# Patient Record
Sex: Male | Born: 1955 | Race: White | Hispanic: No | Smoking: Current every day smoker
Health system: Southern US, Community
[De-identification: ages and names within clinical notes are randomized; demographics above are authoritative.]

## PROBLEM LIST (undated history)

## (undated) DIAGNOSIS — I739 Peripheral vascular disease, unspecified: Secondary | ICD-10-CM

## (undated) DIAGNOSIS — F32A Depression, unspecified: Secondary | ICD-10-CM

## (undated) DIAGNOSIS — Z720 Tobacco use: Secondary | ICD-10-CM

## (undated) DIAGNOSIS — E039 Hypothyroidism, unspecified: Secondary | ICD-10-CM

## (undated) DIAGNOSIS — J45909 Unspecified asthma, uncomplicated: Secondary | ICD-10-CM

## (undated) DIAGNOSIS — I639 Cerebral infarction, unspecified: Secondary | ICD-10-CM

## (undated) DIAGNOSIS — R160 Hepatomegaly, not elsewhere classified: Secondary | ICD-10-CM

## (undated) DIAGNOSIS — G459 Transient cerebral ischemic attack, unspecified: Secondary | ICD-10-CM

## (undated) DIAGNOSIS — F329 Major depressive disorder, single episode, unspecified: Secondary | ICD-10-CM

## (undated) DIAGNOSIS — J449 Chronic obstructive pulmonary disease, unspecified: Secondary | ICD-10-CM

## (undated) DIAGNOSIS — C4490 Unspecified malignant neoplasm of skin, unspecified: Secondary | ICD-10-CM

## (undated) DIAGNOSIS — F1010 Alcohol abuse, uncomplicated: Secondary | ICD-10-CM

## (undated) DIAGNOSIS — I779 Disorder of arteries and arterioles, unspecified: Secondary | ICD-10-CM

## (undated) HISTORY — DX: Hepatomegaly, not elsewhere classified: R16.0

## (undated) HISTORY — DX: Depression, unspecified: F32.A

## (undated) HISTORY — DX: Cerebral infarction, unspecified: I63.9

## (undated) HISTORY — PX: ILIAC ARTERY STENT: SHX1786

## (undated) HISTORY — DX: Unspecified malignant neoplasm of skin, unspecified: C44.90

## (undated) HISTORY — DX: Disorder of arteries and arterioles, unspecified: I77.9

## (undated) HISTORY — DX: Transient cerebral ischemic attack, unspecified: G45.9

## (undated) HISTORY — DX: Tobacco use: Z72.0

## (undated) HISTORY — DX: Hypothyroidism, unspecified: E03.9

## (undated) HISTORY — DX: Major depressive disorder, single episode, unspecified: F32.9

## (undated) HISTORY — DX: Alcohol abuse, uncomplicated: F10.10

## (undated) HISTORY — DX: Peripheral vascular disease, unspecified: I73.9

---

## 2017-02-18 ENCOUNTER — Encounter: Payer: Self-pay | Admitting: Emergency Medicine

## 2017-02-18 ENCOUNTER — Emergency Department
Admission: EM | Admit: 2017-02-18 | Discharge: 2017-02-19 | Disposition: A | Discharge status: Home / Self Care | Payer: Medicare Other | Attending: Emergency Medicine | Admitting: Emergency Medicine

## 2017-02-18 DIAGNOSIS — R45851 Suicidal ideations: Secondary | ICD-10-CM | POA: Diagnosis not present

## 2017-02-18 DIAGNOSIS — F1010 Alcohol abuse, uncomplicated: Secondary | ICD-10-CM | POA: Diagnosis not present

## 2017-02-18 DIAGNOSIS — F17210 Nicotine dependence, cigarettes, uncomplicated: Secondary | ICD-10-CM | POA: Diagnosis not present

## 2017-02-18 DIAGNOSIS — F1994 Other psychoactive substance use, unspecified with psychoactive substance-induced mood disorder: Secondary | ICD-10-CM

## 2017-02-18 DIAGNOSIS — F329 Major depressive disorder, single episode, unspecified: Secondary | ICD-10-CM | POA: Diagnosis not present

## 2017-02-18 DIAGNOSIS — J449 Chronic obstructive pulmonary disease, unspecified: Secondary | ICD-10-CM | POA: Diagnosis not present

## 2017-02-18 DIAGNOSIS — Z008 Encounter for other general examination: Secondary | ICD-10-CM | POA: Diagnosis present

## 2017-02-18 HISTORY — DX: Chronic obstructive pulmonary disease, unspecified: J44.9

## 2017-02-18 LAB — CBC WITH DIFFERENTIAL/PLATELET
BASOS ABS: 0.1 10*3/uL (ref 0–0.1)
Basophils Relative: 3 %
EOS PCT: 5 %
Eosinophils Absolute: 0.2 10*3/uL (ref 0–0.7)
HCT: 38.5 % — ABNORMAL LOW (ref 40.0–52.0)
Hemoglobin: 13.6 g/dL (ref 13.0–18.0)
LYMPHS PCT: 55 %
Lymphs Abs: 2.8 10*3/uL (ref 1.0–3.6)
MCH: 38 pg — ABNORMAL HIGH (ref 26.0–34.0)
MCHC: 35.5 g/dL (ref 32.0–36.0)
MCV: 107.1 fL — ABNORMAL HIGH (ref 80.0–100.0)
Monocytes Absolute: 0.3 10*3/uL (ref 0.2–1.0)
Monocytes Relative: 5 %
NEUTROS ABS: 1.6 10*3/uL (ref 1.4–6.5)
NEUTROS PCT: 32 %
PLATELETS: 115 10*3/uL — AB (ref 150–440)
RBC: 3.59 MIL/uL — AB (ref 4.40–5.90)
RDW: 13.6 % (ref 11.5–14.5)
WBC: 5.1 10*3/uL (ref 3.8–10.6)

## 2017-02-18 LAB — COMPREHENSIVE METABOLIC PANEL
ALT: 21 U/L (ref 17–63)
ANION GAP: 10 (ref 5–15)
AST: 63 U/L — ABNORMAL HIGH (ref 15–41)
Albumin: 3.6 g/dL (ref 3.5–5.0)
Alkaline Phosphatase: 74 U/L (ref 38–126)
BUN: 5 mg/dL — ABNORMAL LOW (ref 6–20)
CO2: 22 mmol/L (ref 22–32)
Calcium: 8.9 mg/dL (ref 8.9–10.3)
Chloride: 103 mmol/L (ref 101–111)
Creatinine, Ser: 0.97 mg/dL (ref 0.61–1.24)
GFR calc Af Amer: >60 mL/min (ref >60)
Glucose, Bld: 86 mg/dL (ref 65–99)
POTASSIUM: 4.5 mmol/L (ref 3.5–5.1)
Sodium: 135 mmol/L (ref 135–145)
TOTAL PROTEIN: 7.4 g/dL (ref 6.5–8.1)
Total Bilirubin: 0.8 mg/dL (ref 0.3–1.2)

## 2017-02-18 LAB — ETHANOL: ALCOHOL ETHYL (B): 343 mg/dL — AB (ref <5)

## 2017-02-18 LAB — URINE DRUG SCREEN, QUALITATIVE (ARMC ONLY)
AMPHETAMINES, UR SCREEN: NOT DETECTED
BENZODIAZEPINE, UR SCRN: NOT DETECTED
Barbiturates, Ur Screen: NOT DETECTED
Cannabinoid 50 Ng, Ur ~~LOC~~: NOT DETECTED
Cocaine Metabolite,Ur ~~LOC~~: NOT DETECTED
MDMA (Ecstasy)Ur Screen: NOT DETECTED
METHADONE SCREEN, URINE: NOT DETECTED
Opiate, Ur Screen: NOT DETECTED
PHENCYCLIDINE (PCP) UR S: NOT DETECTED
TRICYCLIC, UR SCREEN: NOT DETECTED

## 2017-02-18 NOTE — BH Assessment (Signed)
Assessment Note  Dylan Ramos is an 61 y.o. male. [name] arrived to the ED by way of [facility] [employer].  He reports that he has no ideal as to why he is here "I told my daughter to come take my pistol because I was having suicidal thoughts.  He states that "Why would I call for her to get the pistols if I really was going to hurt myself".  He shared that his wife committed suicide 13 years ago and he saw what it did to his daughters and he had no intent on harming himself. He reports symptoms of depression.  He states that he worries because he cannot afford medical insurance and he is on disability and he is unable to finance the things he needs.  He shared that he has no intentions of killing himself. He states that he has never had services for his depressive symptoms. He denied symptoms of anxiety. He reports that he can see "paranormal things", but does not have auditory hallucinations. He denied suicidal intent. He denied homicidal ideation or intent.  He reports that he drinks beer every day.  He denied use of drugs. BAC = 343. IVC paperwork reports "Threats to kill himself saying he is going to hurt others, seeing people who are not there". TTS contacted Judeth Horn (Daughter) - [phone].  She reports "He was being belligerent and talking about suicide.  She states that he said that he was going to take the gun he gave her to blow his head off. She further states that he stated that he would fly off the handle and hurt somebody.  She shared that when he was talking to the he was "talking to someone who was inside of himself". She reports that he is a severe alcoholic.  Diagnosis: Depression, SI, Visual hallucinations, Alcohol abuse  Past Medical History:  Past Medical History:  Diagnosis Date  . COPD (chronic obstructive pulmonary disease) ([place])     History reviewed. No pertinent surgical history.  Family History: No family history on file.  Social History:  reports  that he has been smoking Cigarettes.  He has been smoking about 1.50 packs per day. He does not have any smokeless tobacco history on file. His alcohol and drug histories are not on file.  Additional Social History:  Alcohol / Drug Use History of alcohol / drug use?: Yes Substance #1 Name of Substance 1: Beer (Alcohol) 1 - Age of First Use: 9 1 - Amount (size/oz): 3-4 natural ice 1 - Frequency: daily 1 - Last Use / Amount: 02/18/2017  CIWA: CIWA-Ar BP: 136/79 Pulse Rate: 84 COWS:    Allergies: No Known Allergies  Home Medications:  (Not in a hospital admission)  OB/GYN Status:  No LMP for male patient.  General Assessment Data Location of Assessment: [facility] ED TTS Assessment: In system Is this a Tele or Face-to-Face Assessment?: Face-to-Face Is this an Initial Assessment or a Re-assessment for this encounter?: Initial Assessment Marital status: Widowed [place] name: n/a Is patient pregnant?: No Pregnancy Status: No Living Arrangements: Alone (Rooming house) Can pt return to current living arrangement?: Yes Admission Status: Involuntary Is patient capable of signing voluntary admission?: Yes Referral Source: Self/Family/Friend Insurance type: None  Medical Screening Exam ([place]) Medical Exam completed: Yes  Crisis Care Plan Living Arrangements: Alone (Rooming house) Legal Guardian: Other: (Self) Name of Psychiatrist: None Name of Therapist: None  Education Status Is patient currently in school?: No Current Grade: n/a Highest grade  of school patient has completed: 3rd Name of school: n/a Contact person: n/a  Risk to self with the past 6 months Suicidal Ideation: Yes-Currently Present Has patient been a risk to self within the past 6 months prior to admission? : No Suicidal Intent: No Has patient had any suicidal intent within the past 6 months prior to admission? : No Is patient at risk for suicide?: No Suicidal Plan?: No Has patient had any suicidal  plan within the past 6 months prior to admission? : No Access to Means: No What has been your use of drugs/alcohol within the last 12 months?: drinks beer daily Previous Attempts/Gestures: No How many times?: 0 Other Self Harm Risks: denied Triggers for Past Attempts: None known Intentional Self Injurious Behavior: None Family Suicide History: Yes (Wife committed suicide) Recent stressful life event(s): Financial Problems Persecutory voices/beliefs?: No Depression: Yes Depression Symptoms: Despondent Substance abuse history and/or treatment for substance abuse?: Yes Suicide prevention information given to non-admitted patients: Not applicable  Risk to Others within the past 6 months Homicidal Ideation: No Does patient have any lifetime risk of violence toward others beyond the six months prior to admission? : No Thoughts of Harm to Others: No Current Homicidal Intent: No Current Homicidal Plan: No Access to Homicidal Means: No Identified Victim: None identified History of harm to others?: No Assessment of Violence: None Noted Violent Behavior Description: None reported Does patient have access to weapons?: No (Gun given to his daughter) Criminal Charges Pending?: No Does patient have a court date: No Is patient on probation?: No  Psychosis Hallucinations: Visual (None present at this time) Delusions: None noted  Mental Status Report Appearance/Hygiene: In scrubs Eye Contact: Good Motor Activity: Freedom of movement Speech: Tangential Level of Consciousness: Alert Mood: Pleasant Affect: Appropriate to circumstance Anxiety Level: None Thought Processes: Tangential Judgement: Unable to Assess Orientation: Person, Place, Time, Situation Obsessive Compulsive Thoughts/Behaviors: None  Cognitive Functioning Concentration: Fair Memory: Recent Intact IQ: Average  ADLScreening [facility] Assessment Services) Patient's cognitive ability adequate to safely complete daily  activities?: Yes Patient able to express need for assistance with ADLs?: Yes Independently performs ADLs?: Yes (appropriate for developmental age)  Prior Inpatient Therapy Prior Inpatient Therapy: No Prior Therapy Dates: n/a Prior Therapy Facilty/Provider(s): n/a Reason for Treatment: n/a  Prior Outpatient Therapy Prior Outpatient Therapy: No Prior Therapy Dates: n/a Prior Therapy Facilty/Provider(s): n/a Reason for Treatment: n/a Does patient have an ACCT team?: No Does patient have Intensive In-House Services?  : No Does patient have [facility] services? : No Does patient have P4CC services?: No  ADL Screening (condition at time of admission) Patient's cognitive ability adequate to safely complete daily activities?: Yes Is the patient deaf or have difficulty hearing?: Yes Does the patient have difficulty seeing, even when wearing glasses/contacts?: No Does the patient have difficulty concentrating, remembering, or making decisions?: No Patient able to express need for assistance with ADLs?: Yes Does the patient have difficulty dressing or bathing?: Yes Independently performs ADLs?: Yes (appropriate for developmental age) Does the patient have difficulty walking or climbing stairs?: Yes Weakness of Legs: Both Weakness of Arms/Hands: None  Home Assistive Devices/Equipment Home Assistive Devices/Equipment: Cane (specify quad or straight), Eyeglasses, Walker (specify type)    Abuse/Neglect Assessment (Assessment to be complete while patient is alone) Physical Abuse: Denies Verbal Abuse: Denies Sexual Abuse: Denies Exploitation of patient/patient's resources: Denies Self-Neglect: Denies     Regulatory affairs officer (For Healthcare) Does Patient Have a Medical Advance Directive?: No    Additional Information 1:1  In Past 12 Months?: No CIRT Risk: No Elopement Risk: No Does patient have medical clearance?: Yes     Disposition:  Disposition Initial Assessment Completed for  this Encounter: Yes Disposition of Patient: Other dispositions  On Site Evaluation by:   Reviewed with Physician:    Elmer Bales 02/18/2017 9:51 PM

## 2017-02-18 NOTE — ED Provider Notes (Signed)
[facility] Emergency Department Provider Note   ____________________________________________   I have reviewed the triage vital signs and the nursing notes.   HISTORY  Chief Complaint Psychiatric Evaluation   History limited by: Intoxication   HPI Dylan Ramos is a 61 y.o. male who presents to the emergency department today because of concerns for suicidal ideation. The patient at present under IVC. The patient states that today he snapped. He would not elaborate as to what caused him to snap. He did realize that he was a danger to himself and he called his daughter to take his gun away. He has been drinking alcohol today. Denies thoughts of wanting to harm himself in the past. Denies any recent medical illness.   Past Medical History:  Diagnosis Date  . COPD (chronic obstructive pulmonary disease) ([facility])     There are no active problems to display for this patient.   History reviewed. No pertinent surgical history.  Prior to Admission medications   Not on File    Allergies Patient has no known allergies.  No family history on file.  Social History Social History  Substance Use Topics  . Smoking status: Current Every Day Smoker    Packs/day: 1.50    Types: Cigarettes  . Smokeless tobacco: Not on file  . Alcohol use Not on file    Review of Systems Constitutional: No fever/chills Eyes: No visual changes. ENT: No sore throat. Cardiovascular: Denies chest pain. Respiratory: Denies shortness of breath. Gastrointestinal: No abdominal pain.  No nausea, no vomiting.  No diarrhea.   Genitourinary: Negative for dysuria. Musculoskeletal: Negative for back pain. Skin: Negative for rash. Neurological: Negative for headaches, focal weakness or numbness.  ____________________________________________   PHYSICAL EXAM:  VITAL SIGNS: ED Triage Vitals  Enc Vitals Group     BP 02/18/17 1908 136/79     Pulse Rate 02/18/17 1908 84     Resp  02/18/17 1908 20     Temp 02/18/17 1908 98.5 F (36.9 C)     Temp Source 02/18/17 1908 Oral     SpO2 02/18/17 1908 96 %     Weight 02/18/17 1909 30 lb (13.6 kg)     Height 02/18/17 1909 5\' 8"  (1.727 m)     Head Circumference --      Peak Flow --      Pain Score 02/18/17 1907 0   Constitutional: Alert and oriented. Well appearing and in no distress. Eyes: Conjunctivae are normal.  ENT   Head: Normocephalic and atraumatic.   Nose: No congestion/rhinnorhea.   Mouth/Throat: Mucous membranes are moist.   Neck: No stridor. Hematological/Lymphatic/Immunilogical: No cervical lymphadenopathy. Cardiovascular: Normal rate, regular rhythm.  No murmurs, rubs, or gallops.  Respiratory: Normal respiratory effort without tachypnea nor retractions. Breath sounds are clear and equal bilaterally. No wheezes/rales/rhonchi. Gastrointestinal: Soft and non tender. No rebound. No guarding.  Genitourinary: Deferred Musculoskeletal: Normal range of motion in all extremities. No lower extremity edema. Neurologic:  Normal speech and language. No gross focal neurologic deficits are appreciated.  Skin:  Skin is warm, dry and intact. No rash noted. Psychiatric: Slightly intoxicated. Patient does endorse the thought of wanting hurt himself earlier today. ____________________________________________    LABS (pertinent positives/negatives)  Labs Reviewed  COMPREHENSIVE METABOLIC PANEL - Abnormal; Notable for the following:       Result Value   BUN 5 (*)    AST 63 (*)    All other components within normal limits  ETHANOL - Abnormal; Notable  for the following:    Alcohol, Ethyl (B) 343 (*)    All other components within normal limits  CBC WITH DIFFERENTIAL/PLATELET - Abnormal; Notable for the following:    RBC 3.59 (*)    HCT 38.5 (*)    MCV 107.1 (*)    MCH 38.0 (*)    Platelets 115 (*)    All other components within normal limits  URINE DRUG SCREEN, QUALITATIVE ([facility] ONLY)      ____________________________________________   EKG  None  ____________________________________________    RADIOLOGY  None  ____________________________________________   PROCEDURES  Procedures  ____________________________________________   INITIAL IMPRESSION / ASSESSMENT AND PLAN / ED COURSE  Pertinent labs & imaging results that were available during my care of the patient were reviewed by me and considered in my medical decision making (see chart for details).  Patient presents to the emergency department today under IVC because of concerns for suicidal ideation. This point patient is somewhat intoxicated. Will have patient be valuable SOC.  ____________________________________________   FINAL CLINICAL IMPRESSION(S) / ED DIAGNOSES  Final diagnoses:  Alcohol abuse  Suicidal ideation     Note: This dictation was prepared with Dragon dictation. Any transcriptional errors that result from this process are unintentional     Nance Pear, MD 02/18/17 2108

## 2017-02-18 NOTE — ED Triage Notes (Signed)
Patient states he had some problems today and thought of harming self. States he asked his daughter to keep his gun so he would not shoot himself. Daughter petitioned for IVC. Arrives with officer.

## 2017-02-18 NOTE — ED Notes (Signed)
Nurse was handed sticky note of patient's daughter's number who will pick him up when he is discharged [phone].

## 2017-02-18 NOTE — ED Notes (Signed)
Date and time results received: 02/18/17 1958  Test: ETOH Critical Value: 343  Name of Provider Notified: Dr. Archie Balboa

## 2017-02-19 DIAGNOSIS — F1994 Other psychoactive substance use, unspecified with psychoactive substance-induced mood disorder: Secondary | ICD-10-CM

## 2017-02-19 DIAGNOSIS — F1010 Alcohol abuse, uncomplicated: Secondary | ICD-10-CM

## 2017-02-19 MED ORDER — VITAMIN B-1 100 MG PO TABS
100.0000 mg | ORAL_TABLET | Freq: Every day | ORAL | Status: DC
  Administered 2017-02-19: 100 mg via ORAL
  Filled 2017-02-19: qty 1

## 2017-02-19 MED ORDER — LORAZEPAM 2 MG/ML IJ SOLN: 0.0000 mg | Freq: Two times a day (BID) | INTRAMUSCULAR | Status: DC

## 2017-02-19 MED ORDER — THIAMINE HCL 100 MG/ML IJ SOLN: 100.0000 mg | Freq: Every day | INTRAMUSCULAR | Status: DC

## 2017-02-19 MED ORDER — LORAZEPAM 1 MG PO TABS
ORAL_TABLET | ORAL | Status: AC
  Filled 2017-02-19: qty 1

## 2017-02-19 MED ORDER — LORAZEPAM 2 MG PO TABS
0.0000 mg | ORAL_TABLET | Freq: Four times a day (QID) | ORAL | Status: DC
  Administered 2017-02-19: 1 mg via ORAL

## 2017-02-19 MED ORDER — LORAZEPAM 2 MG PO TABS: 0.0000 mg | ORAL_TABLET | Freq: Two times a day (BID) | ORAL | Status: DC

## 2017-02-19 MED ORDER — LORAZEPAM 2 MG/ML IJ SOLN: 0.0000 mg | Freq: Four times a day (QID) | INTRAMUSCULAR | Status: DC

## 2017-02-19 NOTE — ED Notes (Signed)
Pt is being d/c home. All belongings is being sent home with patient. Pt verbalized understanding of d/c. Pt denies si/hi/vh/and ah prior to d/c. Pt denies pain. No behavioral issues reported/noted prior to d/c. CIWA score is an zero prior to d/c.

## 2017-02-19 NOTE — ED Provider Notes (Signed)
-----------------------------------------   1:43 AM on 02/19/2017 -----------------------------------------   Blood pressure 136/79, pulse 84, temperature 98.5 F (36.9 C), temperature source Oral, resp. rate 20, height 5\' 8"  (1.727 m), weight 13.6 kg (30 lb), SpO2 96 %.  The patient attempted specialist on-call evaluation however he was too intoxicated. He will require formal psychiatric evaluation in the morning.   Darel Hong, MD 02/19/17 [phone]

## 2017-02-19 NOTE — BH Assessment (Signed)
Clinician consulted with Dr.Clapacs and pt is recommended for d/c. Clinician provided pt with information on accessing community OPT and substance use services.  Pt's daughter Vivien Rota) called inquiring about disposition. Clinician took daughter's contact information and provided it to Pt. Pt states he will contact his daughter to inform her of d/c recommendation.

## 2017-02-19 NOTE — ED Notes (Signed)
Pt is awake. Pt is alert. Pt denies thoughts of self harm. Pt denies ah/vh/and hi at this time. Pt verbalized "he feels safe to go home and have no plans to harm himself or others". Pt denies pain. No s/sx of withdrawals notice/reported. Will cont to monitor pt.

## 2017-02-19 NOTE — ED Notes (Signed)
Patient presents today with officer and is IVC'ed. Patient states he asked daughter to take away gun so he did not hurt himself

## 2017-02-19 NOTE — Consult Note (Signed)
[facility] Face-to-Face Psychiatry Consult   Reason for Consult:  Consult for 61 year old man with a history of alcohol abuse brought to the hospital under involuntary commitment alleging suicidal statements Referring Physician:  Jimmye Norman Patient Identification: Dylan Ramos MRN:  [id number] Principal Diagnosis: Substance induced mood disorder [facility]) Diagnosis:   Patient Active Problem List   Diagnosis Date Noted  . Substance induced mood disorder ([place]) [F19.94] 02/19/2017  . Alcohol abuse [F10.10] 02/19/2017    Total Time spent with patient: 1 hour  Subjective:   Dylan Ramos is a 61 y.o. male patient admitted with "I don't know why they thought I needed to come here".  HPI:  Patient interviewed chart reviewed. 61 year old man came to the emergency room under IVC last night reports by his family that he had made suicidal statements. They reported that he had asked his daughter to take his gun away because he had been making statements about wanting to shoot himself. Consistently since being in the emergency room the patient has denied ever making any statement about shooting himself. He says that he would've never asked his daughter to take the gun in the first place if he actually wanted to harm himself. Patient denies feeling depressed consistently. He says he sleeps fairly well. He does admit that he is weak and run down much of the time and does not eat well. He denies any wish to die or wish to harm himself or wish to harm anyone else. He denies any hallucinations. He admits that he drinks on a daily basis although he tends to minimize it. His blood alcohol level when he presented last night was over 300 although he claims he only had 3 regular size beers yesterday. Patient is not currently receiving any outpatient psychiatric treatment. Admits that he has only shaky memories of the events leading up to his eating brought to the emergency room. He also admits that he has chronic stresses from his  financial problems and his pain and medical issues.  Social history: Patient gets disability. Not eligible apparently for Medicaid. Lives in a rooming house. Has 3 adult daughters who live nearby.  Medical history: History of COPD also history of vascular disease with several stents in his legs and aorta. Despite this he continues to smoke heavily as well as to drink.  Substance abuse history: Long-standing alcohol abuse. He says he's never had a seizure or had delirium tremens. He claims he can stop drinking for weeks at a time when he wants to but he simply prefers to drink alcohol. Denies any abuse of any other drugs. He's had inpatient rehabilitation years ago in the past but no recent substance abuse treatment.  Past Psychiatric History: No history of suicide attempts no history of psychiatric hospitalization. He's not sure if he's ever been prescribed any medicine for depression or anxiety. No known prior psychiatric contact here.    Risk to Self: Suicidal Ideation: Yes-Currently Present Suicidal Intent: No Is patient at risk for suicide?: No Suicidal Plan?: No Access to Means: No What has been your use of drugs/alcohol within the last 12 months?: drinks beer daily How many times?: 0 Other Self Harm Risks: denied Triggers for Past Attempts: None known Intentional Self Injurious Behavior: None Risk to Others: Homicidal Ideation: No Thoughts of Harm to Others: No Current Homicidal Intent: No Current Homicidal Plan: No Access to Homicidal Means: No Identified Victim: None identified History of harm to others?: No Assessment of Violence: None Noted Violent Behavior Description: None reported  Does patient have access to weapons?: No (Gun given to his daughter) Criminal Charges Pending?: No Does patient have a court date: No Prior Inpatient Therapy: Prior Inpatient Therapy: No Prior Therapy Dates: n/a Prior Therapy Facilty/Provider(s): n/a Reason for Treatment: n/a Prior  Outpatient Therapy: Prior Outpatient Therapy: No Prior Therapy Dates: n/a Prior Therapy Facilty/Provider(s): n/a Reason for Treatment: n/a Does patient have an ACCT team?: No Does patient have Intensive In-House Services?  : No Does patient have [facility] services? : No Does patient have P4CC services?: No  Past Medical History:  Past Medical History:  Diagnosis Date  . COPD (chronic obstructive pulmonary disease) ([place])    History reviewed. No pertinent surgical history. Family History: No family history on file. Family Psychiatric  History: Patient says there is substance abuse in his family but does not know of any other mental health problems except that he says one of his daughters is depressed Social History:  History  Alcohol use Not on file     History  Drug use: Unknown    Social History   Social History  . Marital status: Married    Spouse name: N/A  . Number of children: N/A  . Years of education: N/A   Social History Main Topics  . Smoking status: Current Every Day Smoker    Packs/day: 1.50    Types: Cigarettes  . Smokeless tobacco: None  . Alcohol use None  . Drug use: Unknown  . Sexual activity: Not Asked   Other Topics Concern  . None   Social History Narrative  . None   Additional Social History:    Allergies:  No Known Allergies  Labs:  Results for orders placed or performed during the hospital encounter of 02/18/17 (from the past 48 hour(s))  Comprehensive metabolic panel     Status: Abnormal   Collection Time: 02/18/17  7:15 PM  Result Value Ref Range   Sodium 135 135 - 145 mmol/L   Potassium 4.5 3.5 - 5.1 mmol/L   Chloride 103 101 - 111 mmol/L   CO2 22 22 - 32 mmol/L   Glucose, Bld 86 65 - 99 mg/dL   BUN 5 (L) 6 - 20 mg/dL   Creatinine, Ser 0.97 0.61 - 1.24 mg/dL   Calcium 8.9 8.9 - 10.3 mg/dL   Total Protein 7.4 6.5 - 8.1 g/dL   Albumin 3.6 3.5 - 5.0 g/dL   AST 63 (H) 15 - 41 U/L   ALT 21 17 - 63 U/L   Alkaline Phosphatase 74 38 -  126 U/L   Total Bilirubin 0.8 0.3 - 1.2 mg/dL   GFR calc non Af Amer >60 >60 mL/min   GFR calc Af Amer >60 >60 mL/min    Comment: (NOTE) The eGFR has been calculated using the CKD EPI equation. This calculation has not been validated in all clinical situations. eGFR's persistently <60 mL/min signify possible Chronic Kidney Disease.    Anion gap 10 5 - 15  Ethanol     Status: Abnormal   Collection Time: 02/18/17  7:15 PM  Result Value Ref Range   Alcohol, Ethyl (B) 343 (HH) <5 mg/dL    Comment: CRITICAL RESULT CALLED TO, READ BACK BY AND VERIFIED WITH REBECCA UHORCHUK AT 2000 ON 02/18/2017 JJB        LOWEST DETECTABLE LIMIT FOR SERUM ALCOHOL IS 5 mg/dL FOR MEDICAL PURPOSES ONLY   Urine Drug Screen, Qualitative     Status: None   Collection Time: 02/18/17  7:15 PM  Result Value Ref Range   Tricyclic, Ur Screen NONE DETECTED NONE DETECTED   Amphetamines, Ur Screen NONE DETECTED NONE DETECTED   MDMA (Ecstasy)Ur Screen NONE DETECTED NONE DETECTED   Cocaine Metabolite,Ur [place] NONE DETECTED NONE DETECTED   Opiate, Ur Screen NONE DETECTED NONE DETECTED   Phencyclidine (PCP) Ur S NONE DETECTED NONE DETECTED   Cannabinoid 50 Ng, Ur [place] NONE DETECTED NONE DETECTED   Barbiturates, Ur Screen NONE DETECTED NONE DETECTED   Benzodiazepine, Ur Scrn NONE DETECTED NONE DETECTED   Methadone Scn, Ur NONE DETECTED NONE DETECTED    Comment: (NOTE) 169  Tricyclics, urine               Cutoff 1000 ng/mL 200  Amphetamines, urine             Cutoff 1000 ng/mL 300  MDMA (Ecstasy), urine           Cutoff 500 ng/mL 400  Cocaine Metabolite, urine       Cutoff 300 ng/mL 500  Opiate, urine                   Cutoff 300 ng/mL 600  Phencyclidine (PCP), urine      Cutoff 25 ng/mL 700  Cannabinoid, urine              Cutoff 50 ng/mL 800  Barbiturates, urine             Cutoff 200 ng/mL 900  Benzodiazepine, urine           Cutoff 200 ng/mL 1000 Methadone, urine                Cutoff 300 ng/mL 1100 1200 The urine  drug screen provides only a preliminary, unconfirmed 1300 analytical test result and should not be used for non-medical 1400 purposes. Clinical consideration and professional judgment should 1500 be applied to any positive drug screen result due to possible 1600 interfering substances. A more specific alternate chemical method 1700 must be used in order to obtain a confirmed analytical result.  1800 Gas chromato graphy / mass spectrometry (GC/MS) is the preferred 1900 confirmatory method.   CBC with Diff     Status: Abnormal   Collection Time: 02/18/17  7:15 PM  Result Value Ref Range   WBC 5.1 3.8 - 10.6 K/uL   RBC 3.59 (L) 4.40 - 5.90 MIL/uL   Hemoglobin 13.6 13.0 - 18.0 g/dL   HCT 38.5 (L) 40.0 - 52.0 %   MCV 107.1 (H) 80.0 - 100.0 fL   MCH 38.0 (H) 26.0 - 34.0 pg   MCHC 35.5 32.0 - 36.0 g/dL   RDW 13.6 11.5 - 14.5 %   Platelets 115 (L) 150 - 440 K/uL   Neutrophils Relative % 32 %   Neutro Abs 1.6 1.4 - 6.5 K/uL   Lymphocytes Relative 55 %   Lymphs Abs 2.8 1.0 - 3.6 K/uL   Monocytes Relative 5 %   Monocytes Absolute 0.3 0.2 - 1.0 K/uL   Eosinophils Relative 5 %   Eosinophils Absolute 0.2 0 - 0.7 K/uL   Basophils Relative 3 %   Basophils Absolute 0.1 0 - 0.1 K/uL    Current Facility-Administered Medications  Medication Dose Route Frequency Provider Last Rate Last Dose  . LORazepam (ATIVAN) injection 0-4 mg  0-4 mg Intravenous Q6H Darel Hong, MD       Or  . LORazepam (ATIVAN) tablet 0-4 mg  0-4 mg Oral Q6H Darel Hong, MD  1 mg at 02/19/17 0221  . [START ON 02/21/2017] LORazepam (ATIVAN) injection 0-4 mg  0-4 mg Intravenous Q12H Darel Hong, MD       Or  . Derrill Memo ON 02/21/2017] LORazepam (ATIVAN) tablet 0-4 mg  0-4 mg Oral Q12H Rifenbark, Milta Deiters, MD      . thiamine (VITAMIN B-1) tablet 100 mg  100 mg Oral Daily Darel Hong, MD   100 mg at 02/19/17 1042   Or  . thiamine (B-1) injection 100 mg  100 mg Intravenous Daily Darel Hong, MD       No current  outpatient prescriptions on file.    Musculoskeletal: Strength & Muscle Tone: decreased Gait & Station: broad based Patient leans: N/A  Psychiatric Specialty Exam: Physical Exam  Nursing note and vitals reviewed. Constitutional: He appears well-developed. He has a sickly appearance.  HENT:  Head: Normocephalic and atraumatic.  Eyes: Pupils are equal, round, and reactive to light. Conjunctivae are normal.  Neck: Normal range of motion.  Cardiovascular: Normal heart sounds.   Respiratory: Effort normal.  GI: Soft.  Musculoskeletal: Normal range of motion.  Neurological: He is alert.  Skin: Skin is warm and dry.  Psychiatric: He has a normal mood and affect. His speech is normal and behavior is normal. Judgment and thought content normal. He exhibits abnormal recent memory.    Review of Systems  Constitutional: Positive for weight loss.  HENT: Negative.   Eyes: Negative.   Respiratory: Negative.   Cardiovascular: Negative.   Gastrointestinal: Negative.   Musculoskeletal: Negative.   Skin: Negative.   Neurological: Positive for weakness.  Psychiatric/Behavioral: Positive for memory loss and substance abuse. Negative for depression, hallucinations and suicidal ideas. The patient is not nervous/anxious and does not have insomnia.     Blood pressure 129/78, pulse 95, temperature 97.6 F (36.4 C), temperature source Oral, resp. rate 18, height 5' 8" (1.727 m), weight 59 kg (130 lb), SpO2 100 %.Body mass index is 19.77 kg/m.  General Appearance: Disheveled  Eye Contact:  Fair  Speech:  Clear and Coherent  Volume:  Normal  Mood:  Euthymic  Affect:  Appropriate  Thought Process:  Goal Directed  Orientation:  Full (Time, Place, and Person)  Thought Content:  Logical and Tangential  Suicidal Thoughts:  No  Homicidal Thoughts:  No  Memory:  Immediate;   Good Recent;   Fair Remote;   Fair  Judgement:  Fair  Insight:  Shallow  Psychomotor Activity:  Decreased  Concentration:   Concentration: Fair  Recall:  [employer] of Knowledge:  Fair  Language:  Fair  Akathisia:  No  Handed:  Right  AIMS (if indicated):     Assets:  Communication Skills Housing Social Support  ADL's:  Intact  Cognition:  WNL  Sleep:        Treatment Plan Summary: Plan 61 year old man who looks much older than his stated age who has chronic medical problems. Came to the emergency room intoxicated with allegations from his family that he had been talking about shooting himself. Evidently the pistol has been removed from his home and he says he has no other firearms. Patient insists that he has no suicidal intent or plan and he denies any other active symptoms of depression. No evidence of psychosis. He is calm and cooperative now with no sign of delirium. At this point patient does not meet commitment criteria. I suggested to him that when he is intoxicated he may say or do or feel things that he  would not normally do otherwise. He admits to this and admits that he would probably do better if he were to stop drinking. Patient however has no interest in admission to the hospital. He will be released from the emergency room. Discontinue IVC. Case reviewed with emergency room physician. He will be given information to refer him to local resources for outpatient substance abuse treatment  Disposition: No evidence of imminent risk to self or others at present.   Patient does not meet criteria for psychiatric inpatient admission. Supportive therapy provided about ongoing stressors.  Alethia Berthold, MD 02/19/2017 1:35 PM

## 2017-02-19 NOTE — ED Notes (Signed)
Spoke with Maudie Mercury about moving patient to [facility]

## 2017-02-19 NOTE — ED Notes (Signed)
Pt is alert and oriented although still intoxicated. Pt is pleasant and cooperative at this time. CIWA is 5. PRN medication administered and fluids provided. Pt is blaming others for being here but follows staff instructions. Pt asleep in bed soon after arrival and 15 minute checks are ongoing for safety.

## 2017-02-19 NOTE — ED Provider Notes (Signed)
Patient is now awake and sober and denying suicidal ideation. Patient states he feels comfortable going home. We will overturn commitment.   Earleen Newport, MD 02/19/17 1105

## 2017-06-04 ENCOUNTER — Ambulatory Visit: Payer: Self-pay | Admitting: Physician Assistant

## 2017-08-01 ENCOUNTER — Ambulatory Visit: Payer: Medicare Other | Admitting: Physician Assistant

## 2017-08-01 ENCOUNTER — Ambulatory Visit
Admission: RE | Admit: 2017-08-01 | Discharge: 2017-08-01 | Disposition: A | Discharge status: Home / Self Care | Payer: Medicaid Other | Source: Ambulatory Visit | Attending: Physician Assistant | Admitting: Physician Assistant

## 2017-08-01 DIAGNOSIS — Z131 Encounter for screening for diabetes mellitus: Secondary | ICD-10-CM

## 2017-08-01 DIAGNOSIS — I739 Peripheral vascular disease, unspecified: Secondary | ICD-10-CM

## 2017-08-01 DIAGNOSIS — R519 Headache, unspecified: Secondary | ICD-10-CM

## 2017-08-01 DIAGNOSIS — F32A Depression, unspecified: Secondary | ICD-10-CM

## 2017-08-01 DIAGNOSIS — J449 Chronic obstructive pulmonary disease, unspecified: Secondary | ICD-10-CM

## 2017-08-01 DIAGNOSIS — Z23 Encounter for immunization: Secondary | ICD-10-CM

## 2017-08-01 DIAGNOSIS — F1010 Alcohol abuse, uncomplicated: Secondary | ICD-10-CM

## 2017-08-01 DIAGNOSIS — F329 Major depressive disorder, single episode, unspecified: Secondary | ICD-10-CM

## 2017-08-01 DIAGNOSIS — R918 Other nonspecific abnormal finding of lung field: Secondary | ICD-10-CM | POA: Insufficient documentation

## 2017-08-01 DIAGNOSIS — Z720 Tobacco use: Secondary | ICD-10-CM

## 2017-08-01 DIAGNOSIS — Z130 Encounter for screening for diseases of the blood and blood-forming organs and certain disorders involving the immune mechanism: Secondary | ICD-10-CM

## 2017-08-01 DIAGNOSIS — G4700 Insomnia, unspecified: Secondary | ICD-10-CM

## 2017-08-01 DIAGNOSIS — H9190 Unspecified hearing loss, unspecified ear: Secondary | ICD-10-CM | POA: Diagnosis not present

## 2017-08-01 DIAGNOSIS — H269 Unspecified cataract: Secondary | ICD-10-CM

## 2017-08-01 DIAGNOSIS — R51 Headache: Secondary | ICD-10-CM

## 2017-08-01 DIAGNOSIS — Z1329 Encounter for screening for other suspected endocrine disorder: Secondary | ICD-10-CM

## 2017-08-01 DIAGNOSIS — Z0000 Encounter for general adult medical examination without abnormal findings: Secondary | ICD-10-CM

## 2017-08-01 DIAGNOSIS — M5412 Radiculopathy, cervical region: Secondary | ICD-10-CM

## 2017-08-01 DIAGNOSIS — Z13220 Encounter for screening for lipoid disorders: Secondary | ICD-10-CM

## 2017-08-01 MED ORDER — ALBUTEROL SULFATE HFA 108 (90 BASE) MCG/ACT IN AERS: 2.0000 | INHALATION_SPRAY | Freq: Four times a day (QID) | RESPIRATORY_TRACT | 1 refills | Status: DC | PRN

## 2017-08-01 MED ORDER — FLUTICASONE-UMECLIDIN-VILANT 100-62.5-25 MCG/INH IN AEPB: 1.0000 | INHALATION_SPRAY | Freq: Every day | RESPIRATORY_TRACT | 1 refills | Status: DC

## 2017-08-01 MED ORDER — DULOXETINE HCL 30 MG PO CPEP: 30.0000 mg | ORAL_CAPSULE | Freq: Every day | ORAL | 0 refills | Status: DC

## 2017-08-01 NOTE — Progress Notes (Signed)
Patient: Dylan Ramos Male    DOB: 07-20-1955   62 y.o.   MRN: [id number] Visit Date: 08/02/2017  Today's Provider: Trinna Post, PA-C   Chief Complaint  Patient presents with  . Establish Care  . COPD   Subjective:    Dylan Ramos is a 62 y/o man presenting today to establish care. He is here with his daughters Darin Arndt") and [name]. He was seen by a PCP in [place] but is needing care closer to home. He has multiple issues that need to be addressed.  COPD He has been smoking 1 - 1.5 packs per day x 50 years. He has COPD. He is currently using trelegy daily and albuterol rescue as needed, which is four times daily. He continues to smoke and does not desire to quit. He is still having difficulty breathing despite triple therapy. Would like to see a pulmonologist. He has been on disability due to COPD for many years. Family also mentions recent weight loss.  Peripheral Artery Disease He reports he has had ultrasounds prior and they showed very little blood flow in the distal aorta. He reports he has 4-5 stents in each leg in attempts to revascularize. He reports continued leg pain when walking. He also reports leg pain and cramping when eating, so much so that sometimes he might not eat for three days. He also reports erectile dysfunction.  Cataracts He has bilateral cataracts. He reports he was referred to [facility] and in fact has an appt scheduled 08/12/2017 but does not want to travel that far. He would like to be seen closer to home. He reports his vision is darkened, he can't see much of anything. He no longer drives.  Hearing Loss He has bilateral hearing loss, left worse than right. Constantly straining to hear.  Alcohol Abuse He drinks a 6 pack of beer daily. He reports going through a fifth of liquor per month. He was brought to the ER several months ago after he made suicidal gestures while inebriated. He does not want to stop  drinking.  Insomnia Reports he sometimes won't sleep for a full day. Has trouble falling asleep, then sometimes has trouble staying asleep. Or he could sleep too much, it depends.  Headaches He reports almost daily headaches, for which he takes an Advil daily. Reports bilateral, tight feeling. No aura, not pulsating. No head injuries. He reports he has a history of bulging cervical discs and cervical radiculopathy that contributes to this.  Depression  He reports he feels down and depressed some days. He says he can't see much and he can't hear much, so he sits in his house and drinks. He lives alone. His daughter lives two houses down. He denies SI/HI today.  Breathing Problem  He complains of difficulty breathing. This is a chronic problem. Associated symptoms include headaches and rhinorrhea.       No Known Allergies   Current Outpatient Medications:  .  albuterol (PROVENTIL HFA;VENTOLIN HFA) 108 (90 Base) MCG/ACT inhaler, Inhale 2 puffs into the lungs every 6 (six) hours as needed for wheezing or shortness of breath., Disp: 18 g, Rfl: 1 .  Fluticasone-Umeclidin-Vilant (TRELEGY ELLIPTA) 100-62.5-25 MCG/INH AEPB, Inhale 1 puff into the lungs daily., Disp: 28 each, Rfl: 1 .  DULoxetine (CYMBALTA) 30 MG capsule, Take 1 capsule (30 mg total) by mouth daily., Disp: 90 capsule, Rfl: 0  Review of Systems  Constitutional: Positive for fatigue.  HENT: Positive for  ear discharge and rhinorrhea.   Eyes: Positive for pain.  Neurological: Positive for dizziness, tremors, weakness, light-headedness and headaches.  Psychiatric/Behavioral: Positive for agitation, confusion, decreased concentration and sleep disturbance.    Social History   Tobacco Use  . Smoking status: Current Every Day Smoker    Packs/day: 1.50    Types: Cigarettes  Substance Use Topics  . Alcohol use: Not on file   Objective:   There were no vitals taken for this visit. There were no vitals filed for this  visit.   Physical Exam  Constitutional: No distress.  Thin, chronically ill looking man, appears older than stated age.   HENT:  Right Ear: External ear normal.  Left Ear: External ear normal.  Mouth/Throat: No oropharyngeal exudate.  Eyes: Conjunctivae are normal.  Neck: Neck supple.  Cardiovascular: Normal rate and regular rhythm.  Pulmonary/Chest: Effort normal. He has wheezes. He has no rales.  Lymphadenopathy:    He has no cervical adenopathy.        Assessment & Plan:     1. Chronic obstructive pulmonary disease, unspecified COPD type ([place])  Medications refilled today. CXR due to recent weight loss. COPD not controlled on triple therapy, will refer to pulm. Unfortunately, he continues to smoke and he has no desire to quit. He reports he had a pneumonia shot in the past.  - Ambulatory referral to Pulmonology - DG Chest 2 View; Future - albuterol (PROVENTIL HFA;VENTOLIN HFA) 108 (90 Base) MCG/ACT inhaler; Inhale 2 puffs into the lungs every 6 (six) hours as needed for wheezing or shortness of breath.  Dispense: 18 g; Refill: 1 - Fluticasone-Umeclidin-Vilant (TRELEGY ELLIPTA) 100-62.5-25 MCG/INH AEPB; Inhale 1 puff into the lungs daily.  Dispense: 28 each; Refill: 1  2. Peripheral artery disease ([place])  What appears to be very severe PAD, multiple prior stents. Sound like he may be getting claudication even from eating. Will refer for revascularization, have counseled that smoking is absolutely the cause of this. Will also probably need statin, but we must discuss goals of care.  - Ambulatory referral to Vascular Surgery  3. Cataract of both eyes, unspecified cataract type  - Ambulatory referral to Ophthalmology  4. Hearing loss, unspecified hearing loss type, unspecified laterality  - Ambulatory referral to ENT  5. Alcohol abuse  He drinks a six pack of beer a day, plus possibly some liquor. Counseled that this is considered abuse. He understands, does not want to  stop drinking. Liver enlarged today on exam with elevation of AST. Imagine he might have some cirrhosis, possible imaging later.  6. Encounter for medical examination to establish care  Daughter brings up DNR, I do think we need to discuss goals of care at next visit and how this will affect any subsequent workup of his chronic conditions.  7. Need for influenza vaccination  - Flu Vaccine QUAD 6+ mos PF IM (Fluarix Quad PF)  8. Insomnia, unspecified type  Multifactorial: depression, substance abuse, possible day/night reversal.  9. Chronic nonintractable headache, unspecified headache type  Multifactorial: depression, substance abuse, medication overuse, insomnia, dehydration and malnutrition. Advised to cut back on Advil if not working. Encouraged three balanced meals daily with adequate hydration other than alcoholic drinks.  10. Cervical radiculitis   11. Tobacco abuse  Advised to quit smoking. He does not wish to at this time. Counseled 3-10 minutes.  12. Screening cholesterol level  - Lipid Profile  13. Diabetes mellitus screening  - Comprehensive Metabolic Panel (CMET)  14. Screening for thyroid  disorder  - TSH  15. Screening for deficiency anemia  - CBC with Differential  16. Depression, unspecified depression type  - DULoxetine (CYMBALTA) 30 MG capsule; Take 1 capsule (30 mg total) by mouth daily.  Dispense: 90 capsule; Refill: 0  Return in about 1 month (around 09/01/2017) for chronic illness.  The entirety of the information documented in the History of Present Illness, Review of Systems and Physical Exam were personally obtained by me. Portions of this information were initially documented by Ashley Royalty, CMA and reviewed by me for thoroughness and accuracy.        Dylan Post, PA-C  [facility]

## 2017-08-02 LAB — CBC WITH DIFFERENTIAL/PLATELET
Basophils Absolute: 0 10*3/uL (ref 0.0–0.2)
Basos: 1 %
EOS (ABSOLUTE): 0.3 10*3/uL (ref 0.0–0.4)
Eos: 4 %
Hematocrit: 38.7 % (ref 37.5–51.0)
Hemoglobin: 13.6 g/dL (ref 13.0–17.7)
Immature Grans (Abs): 0 10*3/uL (ref 0.0–0.1)
Immature Granulocytes: 0 %
Lymphocytes Absolute: 1.5 10*3/uL (ref 0.7–3.1)
Lymphs: 20 %
MCH: 35.5 pg — ABNORMAL HIGH (ref 26.6–33.0)
MCHC: 35.1 g/dL (ref 31.5–35.7)
MCV: 101 fL — ABNORMAL HIGH (ref 79–97)
Monocytes Absolute: 0.6 10*3/uL (ref 0.1–0.9)
Monocytes: 8 %
Neutrophils Absolute: 5 10*3/uL (ref 1.4–7.0)
Neutrophils: 67 %
Platelets: 132 10*3/uL — ABNORMAL LOW (ref 150–379)
RBC: 3.83 x10E6/uL — ABNORMAL LOW (ref 4.14–5.80)
RDW: 15 % (ref 12.3–15.4)
WBC: 7.4 10*3/uL (ref 3.4–10.8)

## 2017-08-02 LAB — COMPREHENSIVE METABOLIC PANEL
ALT: 24 IU/L (ref 0–44)
AST: 62 IU/L — ABNORMAL HIGH (ref 0–40)
Albumin/Globulin Ratio: 1.2 (ref 1.2–2.2)
Albumin: 4.1 g/dL (ref 3.6–4.8)
Alkaline Phosphatase: 78 IU/L (ref 39–117)
BUN/Creatinine Ratio: 8 — ABNORMAL LOW (ref 10–24)
BUN: 7 mg/dL — ABNORMAL LOW (ref 8–27)
Bilirubin Total: 0.7 mg/dL (ref 0.0–1.2)
CO2: 19 mmol/L — ABNORMAL LOW (ref 20–29)
Calcium: 9.4 mg/dL (ref 8.6–10.2)
Chloride: 95 mmol/L — ABNORMAL LOW (ref 96–106)
Creatinine, Ser: 0.91 mg/dL (ref 0.76–1.27)
GFR calc Af Amer: 104 mL/min/{1.73_m2} (ref >59)
GFR calc non Af Amer: 90 mL/min/{1.73_m2} (ref >59)
Globulin, Total: 3.4 g/dL (ref 1.5–4.5)
Glucose: 79 mg/dL (ref 65–99)
Potassium: 4.2 mmol/L (ref 3.5–5.2)
Sodium: 138 mmol/L (ref 134–144)
Total Protein: 7.5 g/dL (ref 6.0–8.5)

## 2017-08-02 LAB — LIPID PANEL
Chol/HDL Ratio: 2 ratio (ref 0.0–5.0)
Cholesterol, Total: 136 mg/dL (ref 100–199)
HDL: 68 mg/dL (ref >39)
LDL Calculated: 55 mg/dL (ref 0–99)
Triglycerides: 63 mg/dL (ref 0–149)
VLDL Cholesterol Cal: 13 mg/dL (ref 5–40)

## 2017-08-02 LAB — TSH: TSH: 11.14 u[IU]/mL — ABNORMAL HIGH (ref 0.450–4.500)

## 2017-08-06 ENCOUNTER — Telehealth: Payer: Self-pay

## 2017-08-06 NOTE — Telephone Encounter (Signed)
-----   Message from Trinna Post, [place] sent at 08/02/2017  9:46 AM EST ----- Lungs show hyperinflation, consistent with COPD and expected with his smoking. No signs of infection, no visible pulmonary nodules.

## 2017-08-06 NOTE — Telephone Encounter (Signed)
-----   Message from Trinna Post, [place] sent at 08/02/2017 12:12 PM EST ----- Elevated liver enzyme on CMET, this is likely due to alcohol consumption. Fasting glucose is normal, no diabetes. Kidney function normal. Cholesterol well controlled. His TSH is high, might mean possible hypothyroidism, can we please add free T3 and T4 under dx elevated TSH. He has large red blood cells, also likely due to alcohol consumption, but no anemia.

## 2017-08-06 NOTE — Telephone Encounter (Signed)
T4 normal. T3 slightly low, indicating some mild hypothyroidism. We can discuss treatment if desired at follow up visit.

## 2017-08-06 NOTE — Telephone Encounter (Signed)
Left message for Donnetta Hail (Pt's daughter on the [facility]) to call back.  Pt is hard of hearing.   Thanks,   -Mickel Baas

## 2017-08-07 ENCOUNTER — Institutional Professional Consult (permissible substitution): Payer: Self-pay | Admitting: Internal Medicine

## 2017-08-07 NOTE — Telephone Encounter (Signed)
Dylan Ramos advised.    Thanks,   -Mickel Baas

## 2017-08-08 LAB — SPECIMEN STATUS REPORT

## 2017-08-08 LAB — T4, FREE: Free T4: 0.86 ng/dL (ref 0.82–1.77)

## 2017-08-08 LAB — T3, FREE: T3, Free: 1.7 pg/mL — ABNORMAL LOW (ref 2.0–4.4)

## 2017-08-13 ENCOUNTER — Encounter (INDEPENDENT_AMBULATORY_CARE_PROVIDER_SITE_OTHER): Payer: Self-pay | Admitting: Vascular Surgery

## 2017-08-16 NOTE — Progress Notes (Deleted)
The patient is a 62 year old male with a history of nicotine and alcohol abuse.  Imaging personally reviewed, chest x-ray 08/01/17, hyperinflation consistent with emphysema.

## 2017-08-19 ENCOUNTER — Institutional Professional Consult (permissible substitution): Payer: Self-pay | Admitting: Internal Medicine

## 2017-08-26 ENCOUNTER — Ambulatory Visit (INDEPENDENT_AMBULATORY_CARE_PROVIDER_SITE_OTHER): Payer: Medicaid Other | Admitting: Internal Medicine

## 2017-08-26 ENCOUNTER — Encounter: Payer: Self-pay | Admitting: Internal Medicine

## 2017-08-26 ENCOUNTER — Telehealth: Payer: Self-pay | Admitting: Internal Medicine

## 2017-08-26 VITALS — BP 124/70 | HR 80 | Ht 68.0 in | Wt 130.0 lb

## 2017-08-26 DIAGNOSIS — J449 Chronic obstructive pulmonary disease, unspecified: Secondary | ICD-10-CM

## 2017-08-26 DIAGNOSIS — F17210 Nicotine dependence, cigarettes, uncomplicated: Secondary | ICD-10-CM

## 2017-08-26 MED ORDER — NICOTINE 21 MG/24HR TD PT24: 21.0000 mg | MEDICATED_PATCH | Freq: Every day | TRANSDERMAL | 0 refills | Status: DC

## 2017-08-26 NOTE — Patient Instructions (Signed)
Will send for lung function test, lung cancer screening.  Continue your current inhalers, call if refills are needed.  Will send nicotine patches to your pharmacy.   --Quitting smoking is the most important thing that you can do for your health.  --Quitting smoking will have greater affect on your health than any medicine that we can give you.    --The best way to quit is to set a quit date, usually a day that has meaning like someone's birthday.  --Start any medication prescribed for quitting one week before you quit date. Then toss out the cigarettes on your quit date.  --If you start smoking again, start from scratch--set another quit day and try again!

## 2017-08-26 NOTE — Telephone Encounter (Signed)
Pt daughter callings stating they went to [facility] drug and were told that the Patches we sent in are not covered by insurance  Was advised by Pharmacy that if we prescribed Chantix  That it would be covered   Would like to see if we may call this in   Please advise

## 2017-08-26 NOTE — Progress Notes (Signed)
[facility] Consultation      Assessment and Plan:  Severe emphysema with dyspnea on exertion. -Emphysema noted on chest imaging. - Continue Trelegy inhaler, Ventolin inhaler. -Refer for CT lung cancer screening - At next visit will consider referral for pulmonary rehab.  Nicotine abuse. -Discussed the importance of smoke cessation, discussed various tools and ways in which he might quit. -Sent prescription for nicotine patches.  Spent 12 minutes in discussion regarding smoking cessation.  GERD. -Was on omeprazole in the past but found that it was not helpful, and found that Protonix was more helpful for his symptoms.  Date: 08/26/2017  MRN# [id number] Dylan Ramos 20-Dec-1955   Dylan Ramos is a 62 y.o. old male seen in consultation for chief complaint of:    Chief Complaint  Patient presents with  . Advice Only    per Dr. Margretta Sidle for COPD: SOB at all times: cough worse in mornings: chest pain    HPI:   The patient is a 62 year old male he appears older than stated age, with a history of nicotine and alcohol abuse. He is here with daughter and girlfriend who provide much of the history, he is very hard of hearing.  He was diagnosed with asthma 30 yrs ago, and more recently COPD. He coughs a lot in the morning with a lot of mucus. He is short of breath for much of the time. He can walk half a block and he will have to stop.  He Gets out of breath walking a [employer] and has to use a cart, he has to stop and catch his breathe in a grocery store.  He is on disability for his lungs, he worked in [employer]. He is smoking about a ppd, he has tried his wifes patches "and I smoked twice as much". He also tried nicotrol or similar and did not seem to help.  He is on Trelegy once daily, and ventolin a 3 or 4 times per day.  He has no pets at home,  He has constant reflux, he takes apple sauce, he tried omeprazole but did not help. He found protonix helped.   Desat  walk at rest on RA, sat is 98% and HR 70. Walked at slow pace, 180 feet, sat was 98% and HR 78, mild dyspnea.    Imaging personally reviewed, chest x-ray 08/01/17, hyperinflation consistent with emphysema.   PMHX:   Past Medical History:  Diagnosis Date  . COPD (chronic obstructive pulmonary disease) ([place])    Surgical Hx:  History reviewed. No pertinent surgical history. Family Hx:  History reviewed. No pertinent family history. Social Hx:   Social History   Tobacco Use  . Smoking status: Current Every Day Smoker    Packs/day: 1.00    Types: Cigarettes  . Smokeless tobacco: Never Used  Substance Use Topics  . Alcohol use: Not on file  . Drug use: Not on file   Medication:    Current Outpatient Medications:  .  albuterol (PROVENTIL HFA;VENTOLIN HFA) 108 (90 Base) MCG/ACT inhaler, Inhale 2 puffs into the lungs every 6 (six) hours as needed for wheezing or shortness of breath., Disp: 18 g, Rfl: 1 .  DULoxetine (CYMBALTA) 30 MG capsule, Take 1 capsule (30 mg total) by mouth daily., Disp: 90 capsule, Rfl: 0 .  Fluticasone-Umeclidin-Vilant (TRELEGY ELLIPTA) 100-62.5-25 MCG/INH AEPB, Inhale 1 puff into the lungs daily., Disp: 28 each, Rfl: 1   Allergies:  Patient has no known allergies.  Review of Systems:  Gen:  Denies  fever, sweats, chills HEENT: Denies blurred vision, double vision. bleeds, sore throat Cvc:  No dizziness, chest pain. Resp:   Denies cough or sputum production, shortness of breath Gi: Denies swallowing difficulty, stomach pain. Gu:  Denies bladder incontinence, burning urine Ext:   No Joint pain, stiffness. Skin: No skin rash,  hives  Endoc:  No polyuria, polydipsia. Psych: No depression, insomnia. Other:  All other systems were reviewed with the patient and were negative other that what is mentioned in the HPI.   Physical Examination:   VS: BP 124/70 (BP Location: Left Arm, Cuff Size: Normal)   Pulse 80   Ht 5\' 8"  (1.727 m)   Wt 130 lb (59 kg)    SpO2 98%   BMI 19.77 kg/m   General Appearance: No distress  Neuro:without focal findings,  speech normal,  HEENT: PERRLA, EOM intact.   Pulmonary: normal breath sounds, decreased air entry bilaterally. CardiovascularNormal S1,S2.  No m/r/g.   Abdomen: Benign, Soft, non-tender. Renal:  No costovertebral tenderness  GU:  No performed at this time. Endoc: No evident thyromegaly, no signs of acromegaly. Skin:   warm, no rashes, no ecchymosis  Extremities: normal, no cyanosis, clubbing.  Other findings:    LABORATORY PANEL:   CBC No results for input(s): WBC, HGB, HCT, PLT in the last 168 hours. ------------------------------------------------------------------------------------------------------------------  Chemistries  No results for input(s): NA, K, CL, CO2, GLUCOSE, BUN, CREATININE, CALCIUM, MG, AST, ALT, ALKPHOS, BILITOT in the last 168 hours.  Invalid input(s): GFRCGP ------------------------------------------------------------------------------------------------------------------  Cardiac Enzymes No results for input(s): TROPONINI in the last 168 hours. ------------------------------------------------------------  RADIOLOGY:  No results found.     Thank  you for the consultation and for allowing [facility] to assist in the care of your patient. Our recommendations are noted above.  Please contact us if we can be of further service.   Marda Stalker, MD.  Board Certified in Internal Medicine, Pulmonary Medicine, [place], and Sleep Medicine.  [facility] Office Number: [phone]  Patricia Pesa, M.D.  Merton Border, M.D  08/26/2017

## 2017-08-27 ENCOUNTER — Telehealth: Payer: Self-pay | Admitting: *Deleted

## 2017-08-27 ENCOUNTER — Telehealth: Payer: Self-pay | Admitting: Physician Assistant

## 2017-08-27 ENCOUNTER — Encounter: Payer: Self-pay | Admitting: *Deleted

## 2017-08-27 DIAGNOSIS — Z122 Encounter for screening for malignant neoplasm of respiratory organs: Secondary | ICD-10-CM

## 2017-08-27 DIAGNOSIS — Z87891 Personal history of nicotine dependence: Secondary | ICD-10-CM

## 2017-08-27 DIAGNOSIS — Z720 Tobacco use: Secondary | ICD-10-CM | POA: Insufficient documentation

## 2017-08-27 MED ORDER — VARENICLINE TARTRATE 0.5 MG X 11 & 1 MG X 42 PO MISC: ORAL | 0 refills | Status: DC

## 2017-08-27 NOTE — Telephone Encounter (Signed)
LMOM for pt's EC to inform her that the Chantix has been sent in and to call back with any questions.

## 2017-08-27 NOTE — Telephone Encounter (Signed)
Please advise on message below.

## 2017-08-27 NOTE — Telephone Encounter (Signed)
Received referral for initial lung cancer screening scan. Contacted patient and obtained smoking history,(current, 104 pack year) as well as answering questions related to screening process. Patient denies signs of lung cancer such as weight loss or hemoptysis. Patient denies comorbidity that would prevent curative treatment if lung cancer were found. Patient is scheduled for shared decision making visit and CT scan on 09/19/17.

## 2017-08-27 NOTE — Telephone Encounter (Signed)
PA submitted for Chantix via covermymeds [id number] Pending decision.

## 2017-08-27 NOTE — Telephone Encounter (Signed)
FYI--Pt unable to get appointment with ENT until he gets Medicaid to change his PCP to our office.Pt's daughter Annye English  advised of this and states they are working on getting this fixed .She is on [facility]

## 2017-08-27 NOTE — Telephone Encounter (Signed)
Noted, thank you

## 2017-08-27 NOTE — Telephone Encounter (Signed)
Placed order for chantix.

## 2017-08-27 NOTE — Addendum Note (Signed)
Addended by: Laverle Hobby on: 08/27/2017 11:10 AM   Modules accepted: Orders

## 2017-08-28 ENCOUNTER — Telehealth: Payer: Self-pay | Admitting: *Deleted

## 2017-08-28 ENCOUNTER — Telehealth: Payer: Self-pay | Admitting: Internal Medicine

## 2017-08-28 NOTE — Telephone Encounter (Signed)
Pt daughter calling stating pt is needing to reschedule PFT that is schedule for next week They are seeing vein doctor that day   Please call back to reschedule

## 2017-08-28 NOTE — Telephone Encounter (Signed)
PA denied patient must use OTC or without rx per insurance.

## 2017-08-28 NOTE — Telephone Encounter (Signed)
Called and left message with number [phone] that patient's daughter may call and reschedule PFT.

## 2017-09-04 ENCOUNTER — Ambulatory Visit: Payer: Self-pay | Admitting: Physician Assistant

## 2017-09-10 ENCOUNTER — Ambulatory Visit: Payer: Medicare Other | Attending: Internal Medicine

## 2017-09-10 ENCOUNTER — Encounter (INDEPENDENT_AMBULATORY_CARE_PROVIDER_SITE_OTHER): Payer: Self-pay | Admitting: Vascular Surgery

## 2017-09-17 ENCOUNTER — Ambulatory Visit: Payer: Self-pay | Admitting: Physician Assistant

## 2017-09-19 ENCOUNTER — Inpatient Hospital Stay: Payer: Medicare Other | Attending: Nurse Practitioner | Admitting: Nurse Practitioner

## 2017-09-19 ENCOUNTER — Ambulatory Visit (INDEPENDENT_AMBULATORY_CARE_PROVIDER_SITE_OTHER): Payer: Medicare Other | Admitting: Vascular Surgery

## 2017-09-19 ENCOUNTER — Encounter: Payer: Self-pay | Admitting: Physician Assistant

## 2017-09-19 ENCOUNTER — Ambulatory Visit: Admission: RE | Admit: 2017-09-19 | Payer: Self-pay | Source: Ambulatory Visit

## 2017-09-19 ENCOUNTER — Encounter (INDEPENDENT_AMBULATORY_CARE_PROVIDER_SITE_OTHER): Payer: Self-pay | Admitting: Vascular Surgery

## 2017-09-19 ENCOUNTER — Ambulatory Visit (INDEPENDENT_AMBULATORY_CARE_PROVIDER_SITE_OTHER): Payer: Medicaid Other | Admitting: Physician Assistant

## 2017-09-19 ENCOUNTER — Encounter: Payer: Self-pay | Admitting: Nurse Practitioner

## 2017-09-19 VITALS — BP 147/86 | HR 86 | Resp 15 | Ht 68.0 in | Wt 125.0 lb

## 2017-09-19 VITALS — BP 152/74 | HR 78 | Temp 98.1°F | Resp 20 | Wt 125.0 lb

## 2017-09-19 DIAGNOSIS — Z720 Tobacco use: Secondary | ICD-10-CM

## 2017-09-19 DIAGNOSIS — F1011 Alcohol abuse, in remission: Secondary | ICD-10-CM

## 2017-09-19 DIAGNOSIS — L97909 Non-pressure chronic ulcer of unspecified part of unspecified lower leg with unspecified severity: Secondary | ICD-10-CM

## 2017-09-19 DIAGNOSIS — R079 Chest pain, unspecified: Secondary | ICD-10-CM

## 2017-09-19 DIAGNOSIS — Z599 Problem related to housing and economic circumstances, unspecified: Secondary | ICD-10-CM

## 2017-09-19 DIAGNOSIS — R519 Headache, unspecified: Secondary | ICD-10-CM

## 2017-09-19 DIAGNOSIS — E039 Hypothyroidism, unspecified: Secondary | ICD-10-CM

## 2017-09-19 DIAGNOSIS — F32A Depression, unspecified: Secondary | ICD-10-CM

## 2017-09-19 DIAGNOSIS — R51 Headache: Secondary | ICD-10-CM

## 2017-09-19 DIAGNOSIS — Z598 Other problems related to housing and economic circumstances: Secondary | ICD-10-CM

## 2017-09-19 DIAGNOSIS — I70299 Other atherosclerosis of native arteries of extremities, unspecified extremity: Secondary | ICD-10-CM

## 2017-09-19 DIAGNOSIS — J449 Chronic obstructive pulmonary disease, unspecified: Secondary | ICD-10-CM | POA: Insufficient documentation

## 2017-09-19 DIAGNOSIS — R7989 Other specified abnormal findings of blood chemistry: Secondary | ICD-10-CM

## 2017-09-19 DIAGNOSIS — Z87898 Personal history of other specified conditions: Secondary | ICD-10-CM

## 2017-09-19 DIAGNOSIS — R160 Hepatomegaly, not elsewhere classified: Secondary | ICD-10-CM

## 2017-09-19 DIAGNOSIS — Z66 Do not resuscitate: Secondary | ICD-10-CM

## 2017-09-19 DIAGNOSIS — I70229 Atherosclerosis of native arteries of extremities with rest pain, unspecified extremity: Secondary | ICD-10-CM | POA: Insufficient documentation

## 2017-09-19 DIAGNOSIS — M79606 Pain in leg, unspecified: Secondary | ICD-10-CM | POA: Insufficient documentation

## 2017-09-19 DIAGNOSIS — F329 Major depressive disorder, single episode, unspecified: Secondary | ICD-10-CM

## 2017-09-19 MED ORDER — LEVOTHYROXINE SODIUM 25 MCG PO TABS: 25.0000 ug | ORAL_TABLET | Freq: Every day | ORAL | 2 refills | Status: DC

## 2017-09-19 MED ORDER — LEVOTHYROXINE SODIUM 25 MCG PO TABS: 12.5000 ug | ORAL_TABLET | Freq: Every day | ORAL | 0 refills | Status: DC

## 2017-09-19 NOTE — Progress Notes (Signed)
MRN : [id number]  Dylan Ramos is a 62 y.o. (24-Jan-1956) male who presents with chief complaint of  Chief Complaint  Patient presents with  . New Patient (Initial Visit)    PAD  .  History of Present Illness: The patient is seen for evaluation of painful lower extremities and diminished pulses associated with ulceration of the foot.  The patient notes the ulcer has been present for multiple weeks and has not been improving.  It is very painful and has had some drainage.  No specific history of trauma noted by the patient.  The patient denies fever or chills.  the patient does have diabetes which has been difficult to control.  Patient notes prior to the ulcer developing the extremities were painful particularly with ambulation or activity and the discomfort is very consistent day today. Typically, the pain occurs at less than one block, progress is as activity continues to the point that the patient must stop walking. Resting including standing still for several minutes allowed resumption of the activity and the ability to walk a similar distance before stopping again. Uneven terrain and inclined shorten the distance. The pain has been progressive over the past several years.   When last seen by his primary several issues were noted:He reports he has had ultrasounds prior and they showed very little blood flow in the distal aorta. He reports he has 4-5 stents in each leg in attempts to revascularize. He reports continued leg pain when walking. He also reports leg pain and cramping when eating, so much so that sometimes he might not eat for three days.  The patient denies rest pain or dangling of an extremity off the side of the bed during the night for relief. Multiple prior interventions with stents in both legs but no prior open surgeries.  No history of back problems or DJD of the lumbar sacral spine.   The patient denies amaurosis fugax or recent TIA symptoms. There are no recent  neurological changes noted. The patient denies history of DVT, PE or superficial thrombophlebitis. The patient denies recent episodes of angina or shortness of breath.    No outpatient medications have been marked as taking for the 09/19/17 encounter (Office Visit) with Delana Meyer, Dolores Lory, MD.    Past Medical History:  Diagnosis Date  . COPD (chronic obstructive pulmonary disease) ([place])   . Peripheral arterial disease ([place])     No past surgical history on file.  Social History Social History   Tobacco Use  . Smoking status: Current Every Day Smoker    Packs/day: 2.00    Years: 52.00    Pack years: 104.00    Types: Cigarettes  . Smokeless tobacco: Never Used  Substance Use Topics  . Alcohol use: Not on file  . Drug use: Not on file    Family History No family history on file. No family history of bleeding/clotting disorders, porphyria or autoimmune disease  No Known Allergies   REVIEW OF SYSTEMS (Negative unless checked)  Constitutional: [] Weight loss  [] Fever  [] Chills Cardiac: [] Chest pain   [] Chest pressure   [] Palpitations   [] Shortness of breath when laying flat   [] Shortness of breath with exertion. Vascular:  [x] Pain in legs with walking   [] Pain in legs at rest  [] History of DVT   [] Phlebitis   [] Swelling in legs   [] Varicose veins   [] Non-healing ulcers Pulmonary:   [] Uses home oxygen   [] Productive cough   [] Hemoptysis   [] Wheeze  []   COPD   [] Asthma Neurologic:  [] Dizziness   [] Seizures   [] History of stroke   [] History of TIA  [] Aphasia   [] Vissual changes   [] Weakness or numbness in arm   [] Weakness or numbness in leg Musculoskeletal:   [] Joint swelling   [] Joint pain   [] Low back pain Hematologic:  [] Easy bruising  [] Easy bleeding   [] Hypercoagulable state   [] Anemic Gastrointestinal:  [] Diarrhea   [] Vomiting  [] Gastroesophageal reflux/heartburn   [] Difficulty swallowing. Genitourinary:  [] Chronic kidney disease   [] Difficult urination  [] Frequent urination    [] Blood in urine Skin:  [] Rashes   [] Ulcers  Psychological:  [] History of anxiety   []  History of major depression.  Physical Examination  There were no vitals filed for this visit. There is no height or weight on file to calculate BMI. Gen: WD/WN, NAD Head: /AT, No temporalis wasting.  Ear/Nose/Throat: Hearing grossly intact, nares w/o erythema or drainage, poor dentition Eyes: PER, EOMI, sclera nonicteric.  Neck: Supple, no masses.  No bruit or JVD.  Pulmonary:  Good air movement, clear to auscultation bilaterally, no use of accessory muscles.  Cardiac: RRR, normal S1, S2, no Murmurs. Vascular: Ulcer present at the base of the right second toe.  Scab is present which is intact.  No evidence of infection.  No drainage. Vessel Right Left  Radial Palpable Palpable  Brachial Palpable Palpable  Carotid Palpable Palpable  PT  not palpable  not palpable  DP  not palpable  not palpable  Gastrointestinal: soft, non-distended. No guarding/no peritoneal signs.  Musculoskeletal: M/S 5/5 throughout.  No deformity or atrophy.  Neurologic: CN 2-12 intact. Pain and light touch intact in extremities.  Symmetrical.  Speech is fluent. Motor exam as listed above. Psychiatric: Judgment intact, Mood & affect appropriate for pt's clinical situation. Dermatologic: No rashes or ulcers noted.  No changes consistent with cellulitis. Lymph : No Cervical lymphadenopathy, no lichenification or skin changes of chronic lymphedema.  CBC Lab Results  Component Value Date   WBC 7.4 08/01/2017   HGB 13.6 08/01/2017   HCT 38.7 08/01/2017   MCV 101 (H) 08/01/2017   PLT 132 (L) 08/01/2017    BMET    Component Value Date/Time   NA 138 08/01/2017 1213   K 4.2 08/01/2017 1213   CL 95 (L) 08/01/2017 1213   CO2 19 (L) 08/01/2017 1213   GLUCOSE 79 08/01/2017 1213   GLUCOSE 86 02/18/2017 1915   BUN 7 (L) 08/01/2017 1213   CREATININE 0.91 08/01/2017 1213   CALCIUM 9.4 08/01/2017 1213   GFRNONAA 90 08/01/2017  1213   GFRAA 104 08/01/2017 1213   CrCl cannot be calculated (Patient's most recent lab result is older than the maximum 21 days allowed.).  COAG No results found for: INR, PROTIME  Radiology No results found.    Assessment/Plan 1. Atherosclerosis of artery of extremity with ulceration (HCC)  Recommend:  The patient has evidence of severe atherosclerotic changes of both lower extremities associated with ulceration and tissue loss of the foot.  This represents a limb threatening ischemia and places the patient at the risk for limb loss.  Patient should undergo CT angiography of the lower extremities with the hope for planning intervention for limb salvage.  The risks and benefits as well as the alternative therapies was discussed in detail with the patient.  All questions were answered.  Patient agrees to proceed with CT angiography.  The patient will follow up with me in the office after the scan.   -  CT ANGIO AO+BIFEM W & OR WO CONTRAST; Future - VAS [place] ABI WITH/WO TBI; Future  2. Pain of lower extremity, unspecified laterality See #1  3. Nicotine abuse Smoking cessation was stressed  4. Chronic obstructive pulmonary disease, unspecified COPD type ([place]) Continue pulmonary medications and aerosols as already ordered, these medications have been reviewed and there are no changes at this time.   5. Chest pain, unspecified type Continue cardiac and antihypertensive medications as already ordered and reviewed, no changes at this time.  Continue statin as ordered and reviewed, no changes at this time  Nitrates PRN for chest pain  - Ambulatory referral to Cardiology      Hortencia Pilar, MD  09/19/2017 8:51 AM

## 2017-09-19 NOTE — Patient Instructions (Signed)
Hypothyroidism Hypothyroidism is a disorder of the thyroid. The thyroid is a large gland that is located in the lower front of the neck. The thyroid releases hormones that control how the body works. With hypothyroidism, the thyroid does not make enough of these hormones. What are the causes? Causes of hypothyroidism may include:  Viral infections.  Pregnancy.  Your own defense system (immune system) attacking your thyroid.  Certain medicines.  Birth defects.  Past radiation treatments to your head or neck.  Past treatment with radioactive iodine.  Past surgical removal of part or all of your thyroid.  Problems with the gland that is located in the center of your brain (pituitary).  What are the signs or symptoms? Signs and symptoms of hypothyroidism may include:  Feeling as though you have no energy (lethargy).  Inability to tolerate cold.  Weight gain that is not explained by a change in diet or exercise habits.  Dry skin.  Coarse hair.  Menstrual irregularity.  Slowing of thought processes.  Constipation.  Sadness or depression.  How is this diagnosed? Your health care provider may diagnose hypothyroidism with blood tests and ultrasound tests. How is this treated? Hypothyroidism is treated with medicine that replaces the hormones that your body does not make. After you begin treatment, it may take several weeks for symptoms to go away. Follow these instructions at home:  Take medicines only as directed by your health care provider.  If you start taking any new medicines, tell your health care provider.  Keep all follow-up visits as directed by your health care provider. This is important. As your condition improves, your dosage needs may change. You will need to have blood tests regularly so that your health care provider can watch your condition. Contact a health care provider if:  Your symptoms do not get better with treatment.  You are taking thyroid  replacement medicine and: ? You sweat excessively. ? You have tremors. ? You feel anxious. ? You lose weight rapidly. ? You cannot tolerate heat. ? You have emotional swings. ? You have diarrhea. ? You feel weak. Get help right away if:  You develop chest pain.  You develop an irregular heartbeat.  You develop a rapid heartbeat. This information is not intended to replace advice given to you by your health care provider. Make sure you discuss any questions you have with your health care provider. Document Released: 07/02/2005 Document Revised: 12/08/2015 Document Reviewed: 11/17/2013 Elsevier Interactive Patient Education  2018 [employer]  

## 2017-09-19 NOTE — Progress Notes (Signed)
Patient: Dylan Ramos Male    DOB: [birth year]   62 y.o.   MRN: [id number] Visit Date: 09/20/2017  Today's Provider: Trinna Post, PA-C   Chief Complaint  Patient presents with  . Follow-up   Subjective:    HPI Dylan Ramos is a 62 y/o man with history of COPD currently smoking, chronic alcoholism, depression, headaches, cataracts, peripheral artery disease comes, and hypothyroidism comes in today for a follow up. He was last seen in the office on 08/01/17.  In the interim, he has been seen by [facility] for cataracts and is scheduled for surgery later this month. He has been referred urgently to cardiology to assess his anesthesia risk for this surgery. He has had prior eye surgery with conscious sedation but he reports there was too much of a tremor to repeat this technique.  He also visited vascular surgery today and saw Dr. Delana Meyer. He reports the news was poor. He has ABI scheduled as well as a CT angio + bifem. It sounds like from the patient's report that he is being evaluated for multiple inclusions, including mesenteric ischemia. He has little to no blood flow in his lower extremities and has ulcerations on his right foot.  He reports he is going to lose his Medicaid in one month due to making too much money. He reports he needs to get his procedures done before this.  He reports he fell down one night and broke his nose. He reports he was drinking a lot at this point.   He was started on Cymbalta prior for depression and headaches. He reports this has helped his mood and he does think this improved his headaches. He is using ibuprofen much less.   His prior labs revealed elevated TSH and low T4, indicating hypothyroidism. He would like to proceed with treatment today.   He also mentions that he feels he may have bronchitis. He denies having any fevers. Reports that he is coughing more than usual. He has not taken anything OTC for this.  He also brings up that  he wants to sign a form noting that he does not want to be "brought back." He says he doesn't want any chest compressions or a tube put down his throat. He says he wants to die the way the good Lord intended him to.     No Known Allergies   Current Outpatient Medications:  .  albuterol (PROVENTIL HFA;VENTOLIN HFA) 108 (90 Base) MCG/ACT inhaler, Inhale 2 puffs into the lungs every 6 (six) hours as needed for wheezing or shortness of breath., Disp: 18 g, Rfl: 1 .  DULoxetine (CYMBALTA) 30 MG capsule, Take 1 capsule (30 mg total) by mouth daily., Disp: 90 capsule, Rfl: 0 .  fluticasone (FLOVENT HFA) 110 MCG/ACT inhaler, Inhale into the lungs., Disp: , Rfl:  .  Fluticasone-Umeclidin-Vilant (TRELEGY ELLIPTA) 100-62.5-25 MCG/INH AEPB, Inhale 1 puff into the lungs daily., Disp: 28 each, Rfl: 1 .  ibuprofen (ADVIL,MOTRIN) 800 MG tablet, Take 800 mg by mouth every 8 (eight) hours as needed., Disp: , Rfl:  .  levothyroxine (SYNTHROID, LEVOTHROID) 25 MCG tablet, Take 0.5 tablets (12.5 mcg total) by mouth daily before breakfast., Disp: 45 tablet, Rfl: 0 .  nicotine (NICODERM CQ - DOSED IN MG/24 HOURS) 21 mg/24hr patch, Place 1 patch (21 mg total) onto the skin daily. (Patient not taking: Reported on 09/19/2017), Disp: 28 patch, Rfl: 0 .  varenicline (CHANTIX STARTING MONTH PAK) 0.5  MG X 11 & 1 MG X 42 tablet, Take one 0.5 mg tablet by mouth once daily for 3 days, then increase to one 0.5 mg tablet twice daily for 4 days, then increase to one 1 mg tablet twice daily. (Patient not taking: Reported on 09/19/2017), Disp: 53 tablet, Rfl: 0  Review of Systems  Constitutional: Positive for fatigue.  HENT: Positive for congestion, hearing loss, postnasal drip and sneezing.        Hearing loss no different than baseline.   Respiratory: Positive for cough, chest tightness, shortness of breath and wheezing.   Cardiovascular: Negative for chest pain, palpitations and leg swelling.  Musculoskeletal: Positive for  arthralgias and back pain.  Allergic/Immunologic: Negative for environmental allergies.  Neurological: Positive for headaches.    Social History   Tobacco Use  . Smoking status: Current Every Day Smoker    Packs/day: 2.00    Years: 52.00    Pack years: 104.00    Types: Cigarettes  . Smokeless tobacco: Never Used  Substance Use Topics  . Alcohol use: Not on file   Objective:   BP (!) 152/74 (BP Location: Right Arm, Patient Position: Sitting, Cuff Size: Normal)   Pulse 78   Temp 98.1 F (36.7 C)   Resp 20   Wt 125 lb (56.7 kg)   SpO2 98%   BMI 19.01 kg/m  Vitals:   09/19/17 1057  BP: (!) 152/74  Pulse: 78  Resp: 20  Temp: 98.1 F (36.7 C)  SpO2: 98%  Weight: 125 lb (56.7 kg)     Physical Exam  Constitutional: He is oriented to person, place, and time. He appears well-developed.  HENT:  Head:    Cardiovascular: Normal rate and regular rhythm.  Pulmonary/Chest: Effort normal and breath sounds normal.  Lymphadenopathy:    He has no cervical adenopathy.  Neurological: He is alert and oriented to person, place, and time.  Skin: Skin is warm and dry.  Psychiatric: He has a normal mood and affect. His behavior is normal.        Assessment & Plan:     1. DNR (do not resuscitate)  I have reviewed explicitly the terms of a DNR that includes if his heart or lungs were to stop working, no efforts at CPR would be undertaken. This does not apply to other medically indicated treatment, which he would still receive if appropriate. He expresses understanding and I have signed the form. I have instructed him to post the form on his refrigerator or on the headboard of his bed. He should bring it with him when he goes to hospitals for surgeries and procedures. He has been notified that he may change his mind and we can reevaluate the decision. He expresses understanding and wishes to proceed with a DNR.  2. Enlarged liver  - Comprehensive Metabolic Panel (CMET) - US Abdomen  Limited RUQ; Future  3. History of alcohol abuse  - Comprehensive Metabolic Panel (CMET) - US Abdomen Limited RUQ; Future  4. Elevated TSH  - TSH - levothyroxine (SYNTHROID, LEVOTHROID) 25 MCG tablet; Take 0.5 tablets (12.5 mcg total) by mouth daily before breakfast.  Dispense: 45 tablet; Refill: 0  5. Hypothyroidism, unspecified type  Will start 12.5 mcg synthroid daily. Check in one month.  6. Financial difficulties  Says he does not qualify for Medicaid but I do wonder if he is filling out forms correctly and getting the appropriate benefits. Will refer him to [facility] for help with filing for Medicaid and  Medicare.  - Ambulatory referral to [facility]  7. Depression, unspecified depression type  Continue Cymbalta, it seems to be helping. Will check liver function today. He has been counseled that no antidepressants work well with alcohol and it would be advisable to stop drinking. However, since he is not willing to do that, we have to weigh risks and benefits of this medication. His drinking can be intermittent. He says he may not drink for weeks if he cannot afford it. At this point, improvement in his mood and headaches outweigh risks and he expresses desire to continue Cymbalta.  8. Nonintractable headache, unspecified chronicity pattern, unspecified headache type  Continue Cymbalta.  Return in about 1 month (around 10/20/2017).  The entirety of the information documented in the History of Present Illness, Review of Systems and Physical Exam were personally obtained by me. Portions of this information were initially documented by Jacqualine Code, CMA and reviewed by me for thoroughness and accuracy.         Trinna Post, PA-C  [facility]

## 2017-09-20 ENCOUNTER — Encounter (INDEPENDENT_AMBULATORY_CARE_PROVIDER_SITE_OTHER): Payer: Self-pay | Admitting: Vascular Surgery

## 2017-09-20 LAB — COMPREHENSIVE METABOLIC PANEL
ALT: 27 IU/L (ref 0–44)
AST: 81 IU/L — ABNORMAL HIGH (ref 0–40)
Albumin/Globulin Ratio: 1.2 (ref 1.2–2.2)
Albumin: 3.6 g/dL (ref 3.6–4.8)
Alkaline Phosphatase: 93 IU/L (ref 39–117)
BUN/Creatinine Ratio: 7 — ABNORMAL LOW (ref 10–24)
BUN: 6 mg/dL — ABNORMAL LOW (ref 8–27)
Bilirubin Total: 1.1 mg/dL (ref 0.0–1.2)
CO2: 22 mmol/L (ref 20–29)
Calcium: 9 mg/dL (ref 8.6–10.2)
Chloride: 90 mmol/L — ABNORMAL LOW (ref 96–106)
Creatinine, Ser: 0.84 mg/dL (ref 0.76–1.27)
GFR calc Af Amer: 108 mL/min/{1.73_m2} (ref >59)
GFR calc non Af Amer: 94 mL/min/{1.73_m2} (ref >59)
Globulin, Total: 3 g/dL (ref 1.5–4.5)
Glucose: 91 mg/dL (ref 65–99)
Potassium: 3.5 mmol/L (ref 3.5–5.2)
Sodium: 128 mmol/L — ABNORMAL LOW (ref 134–144)
Total Protein: 6.6 g/dL (ref 6.0–8.5)

## 2017-09-20 LAB — TSH: TSH: 7.92 u[IU]/mL — ABNORMAL HIGH (ref 0.450–4.500)

## 2017-09-24 NOTE — Addendum Note (Signed)
Addended by: Trinna Post on: 09/24/2017 01:26 PM   Modules accepted: Orders

## 2017-09-25 ENCOUNTER — Ambulatory Visit
Admission: RE | Admit: 2017-09-25 | Discharge: 2017-09-25 | Disposition: A | Discharge status: Home / Self Care | Payer: Medicare Other | Source: Ambulatory Visit | Attending: Physician Assistant | Admitting: Physician Assistant

## 2017-09-25 ENCOUNTER — Ambulatory Visit (INDEPENDENT_AMBULATORY_CARE_PROVIDER_SITE_OTHER): Payer: Self-pay | Admitting: Cardiovascular Disease

## 2017-09-25 ENCOUNTER — Encounter: Payer: Self-pay | Admitting: Cardiovascular Disease

## 2017-09-25 VITALS — BP 100/50 | HR 77 | Ht 68.0 in | Wt 125.2 lb

## 2017-09-25 DIAGNOSIS — F1011 Alcohol abuse, in remission: Secondary | ICD-10-CM

## 2017-09-25 DIAGNOSIS — R160 Hepatomegaly, not elsewhere classified: Secondary | ICD-10-CM | POA: Diagnosis not present

## 2017-09-25 DIAGNOSIS — Z87898 Personal history of other specified conditions: Secondary | ICD-10-CM | POA: Insufficient documentation

## 2017-09-25 DIAGNOSIS — Z720 Tobacco use: Secondary | ICD-10-CM

## 2017-09-25 DIAGNOSIS — F1010 Alcohol abuse, uncomplicated: Secondary | ICD-10-CM

## 2017-09-25 DIAGNOSIS — I739 Peripheral vascular disease, unspecified: Secondary | ICD-10-CM

## 2017-09-25 DIAGNOSIS — J449 Chronic obstructive pulmonary disease, unspecified: Secondary | ICD-10-CM

## 2017-09-25 DIAGNOSIS — R079 Chest pain, unspecified: Secondary | ICD-10-CM

## 2017-09-25 MED ORDER — ATORVASTATIN CALCIUM 10 MG PO TABS: 10.0000 mg | ORAL_TABLET | Freq: Every day | ORAL | 3 refills | Status: DC

## 2017-09-25 MED ORDER — ASPIRIN EC 81 MG PO TBEC: 81.0000 mg | DELAYED_RELEASE_TABLET | Freq: Every day | ORAL | 3 refills | Status: AC

## 2017-09-25 NOTE — Progress Notes (Addendum)
Cardiology Office Note  Date:  09/25/2017   ID:  CLEVE PAOLILLO, DOB Mar 29, 1956, MRN [id number]  PCP:  Trinna Post, PA-C   Chief Complaint  Patient presents with  . other    Ref by Dr. Delana Meyer for cardiac clearance for cataract surgery & will have a CT ANGIO AO+BIFEM W & OR WO CONTRAST. Meds reviewed by the pt's daughter Vivien Rota). Pt. c/o chest pain in mid sternum, shortness of breath and bilateral leg pain.     HPI:  Mr. Dylan Ramos is a 62 year old gentleman with past medical history of Smoker, active COPD/severe emphysema GERD Suspected cirrhosis PAD, Previous stenting to lower extremities, aorta disease  Alcohol abuse, 6 pack daily  Previous history of suicidal ideation while inebriated Depression Referred by Dr. Delana Meyer for consultation of his severe aortic disease, mesenteric ischemia,  preop cardiovascular evaluation for vascular surgery  Presents with several family members today they provide much of the history,  patient is very hard of hearing He continues to smoke several packs per day, drinks beer daily no desire to quit  Was not aware of lower extremity ulceration on his foot until found by Dr. Delana Meyer At times he does report having painful lower extremities Feet and toes feel cold, look white to family members Was noted to have diminished pulses is by Dr. Delana Meyer Ulcer for the past several weeks and has not been improving.  Reports he does not eat very much, losing significant weight  Has significant abdominal cramping when eating,  Anorexia, often does not eat for several days in a row Living of beer and cigarettes  Multiple prior interventions with stents in both legs but no prior open surgeries.  Reports having some chest pain symptoms with exertion, also some shortness of breath Does not walk very far at baseline  EKG personally reviewed by myself on todays visit Shows normal sinus rhythm with rate 77 bpm no significant ST or T wave changes   PMH:    has a past medical history of Alcohol abuse, COPD (chronic obstructive pulmonary disease) ([place]), Depression, Disease of the aorta ([place]), Enlarged liver, Hypothyroidism, Mini stroke ([place]), Peripheral arterial disease ([place]), Skin cancer, and Tobacco abuse.  PSH:    Past Surgical History:  Procedure Laterality Date  . ILIAC ARTERY STENT     Right common and external iliac artery stent placement    Current Outpatient Medications  Medication Sig Dispense Refill  . albuterol (PROVENTIL HFA;VENTOLIN HFA) 108 (90 Base) MCG/ACT inhaler Inhale 2 puffs into the lungs every 6 (six) hours as needed for wheezing or shortness of breath. 18 g 1  . DULoxetine (CYMBALTA) 30 MG capsule Take 1 capsule (30 mg total) by mouth daily. 90 capsule 0  . fluticasone (FLOVENT HFA) 110 MCG/ACT inhaler Inhale into the lungs.    . Fluticasone-Umeclidin-Vilant (TRELEGY ELLIPTA) 100-62.5-25 MCG/INH AEPB Inhale 1 puff into the lungs daily. 28 each 1  . ibuprofen (ADVIL,MOTRIN) 800 MG tablet Take 800 mg by mouth every 8 (eight) hours as needed.    Marland Kitchen levothyroxine (SYNTHROID, LEVOTHROID) 25 MCG tablet Take 0.5 tablets (12.5 mcg total) by mouth daily before breakfast. 45 tablet 0  . aspirin EC 81 MG tablet Take 1 tablet (81 mg total) by mouth daily. 90 tablet 3  . atorvastatin (LIPITOR) 10 MG tablet Take 1 tablet (10 mg total) by mouth daily. 90 tablet 3   No current facility-administered medications for this visit.      Allergies:   Patient has no known  allergies.   Social History:  The patient  reports that he has been smoking cigarettes.  He has a 104.00 pack-year smoking history. he has never used smokeless tobacco. He reports that he drinks alcohol. He reports that he uses drugs. Drug: Marijuana.   Family History:   family history includes Cancer - Cervical in his father; Cirrhosis in his brother; Clotting disorder in his brother; Heart attack in his mother; Heart disease in his mother; Heart failure in his mother;  Hyperlipidemia in his mother; Hypertension in his mother.    Review of Systems: Review of Systems  Constitutional: Positive for weight loss.  Respiratory: Positive for shortness of breath.   Cardiovascular: Positive for chest pain.  Gastrointestinal: Negative.        Anorexia  Musculoskeletal: Negative.   Neurological: Positive for weakness.  Psychiatric/Behavioral: Negative.   All other systems reviewed and are negative.    PHYSICAL EXAM: VS:  BP (!) 100/50 (BP Location: Right Arm, Patient Position: Sitting, Cuff Size: Normal)   Pulse 77   Ht 5\' 8"  (1.727 m)   Wt 125 lb 4 oz (56.8 kg)   BMI 19.04 kg/m  , BMI Body mass index is 19.04 kg/m. GEN: Thin, muscle atrophy, in no acute distress  HEENT: normal  Neck: no JVD, carotid bruits, or masses Cardiac: RRR; no murmurs, rubs, or gallops,no edema decreased pulses  lower extremities ,nonpalpable Respiratory:  clear to auscultation bilaterally, normal work of breathing GI: soft, nontender, nondistended, + BS MS: no deformity or atrophy  Skin: warm and dry, no rash Neuro:  Strength and sensation are intact Psych: euthymic mood, full affect    Recent Labs: 08/01/2017: Hemoglobin 13.6; Platelets 132 09/19/2017: ALT 27; BUN 6; Creatinine, Ser 0.84; Potassium 3.5; Sodium 128; TSH 7.920    Lipid Panel Lab Results  Component Value Date   CHOL 136 08/01/2017   HDL 68 08/01/2017   LDLCALC 55 08/01/2017   TRIG 63 08/01/2017      Wt Readings from Last 3 Encounters:  09/25/17 125 lb 4 oz (56.8 kg)  09/19/17 125 lb (56.7 kg)  09/19/17 125 lb (56.7 kg)       ASSESSMENT AND PLAN:  PAD (peripheral artery disease) (HCC) Extensive peripheral arterial disease including aorta lower extremities Recently seen by Dr. Delana Meyer, nonhealing ulcer right lower extremity Given chest pain symptoms, shortness of breath, we have ordered stress test for preoperative clearance  Chronic obstructive pulmonary disease, unspecified COPD type  ([place]) Long history of smoking, still smoking 2 packs/day  Chest pain, unspecified type - Plan: EKG 12-Lead, NM Myocar Multi W/Spect W/Wall Motion / EF, ECHOCARDIOGRAM COMPLETE High risk of coronary disease given aortic and lower externally arterial disease We have ordered pharmacologic Myoview to rule out high risk ischemia before upcoming surgeries We have recommended aspirin with low-dose Lipitor, especially in light of his significant PAD  Shortness of breath Likely secondary to underlying smoking, emphysema Echocardiogram is also been ordered given shortness of breath symptoms We will estimate ejection fraction, evaluate for pulmonary hypertension, valvular disease Heavy alcohol history, unable to exclude alcohol cardiomyopathy  Alcohol abuse Continues to drink 6 beers daily per the notes No desire to quit  Nicotine abuse Long discussion concerning his smoking cessation No desire to quit smoking, does not want Chantix or other modalities More than 5 minutes spent discussing smoking cessation techniques and need to quit smoking  Disposition:   F/U as needed   Total encounter time more than 60 minutes  Greater than  50% was spent in counseling and coordination of care with the patient    Orders Placed This Encounter  Procedures  . NM Myocar Multi W/Spect W/Wall Motion / EF  . EKG 12-Lead  . ECHOCARDIOGRAM COMPLETE     Signed, Esmond Plants, M.D., Ph.D. 09/25/2017  [place], [place]

## 2017-09-25 NOTE — Patient Instructions (Addendum)
Medication Instructions:   We will start lipitor 10 mg daily   Testing/Procedures:  We will schedule a stress test,myoview Lexiscan No smoking 24 hours before the test  We will schedule an echocardiogram for shortness of breath and chest pain   Follow-Up: It was a pleasure seeing you in the office today. Please call us if you have new issues that need to be addressed before your next appt.  [phone]  Your physician wants you to follow-up in: as needed  If you need a refill on your cardiac medications before your next appointment, please call your pharmacy.  For educational health videos Log in to : [email or web address] Or : [email or web address], password : [email or web address]     [place]  Your caregiver has ordered a Stress Test with nuclear imaging. The purpose of this test is to evaluate the blood supply to your heart muscle. This procedure is referred to as a "Non-Invasive Stress Test." This is because other than having an IV started in your vein, nothing is inserted or "invades" your body. Cardiac stress tests are done to find areas of poor blood flow to the heart by determining the extent of coronary artery disease (CAD). Some patients exercise on a treadmill, which naturally increases the blood flow to your heart, while others who are  unable to walk on a treadmill due to physical limitations have a pharmacologic/chemical stress agent called Lexiscan . This medicine will mimic walking on a treadmill by temporarily increasing your coronary blood flow.   Please note: these test may take anywhere between 2-4 hours to complete  PLEASE REPORT TO [place] TO GO  Date of Procedure:__Friday March 15th_______  Arrival Time for Procedure:______09:15AM______    PLEASE NOTIFY THE OFFICE AT LEAST 24 HOURS IN ADVANCE IF YOU ARE UNABLE TO KEEP YOUR APPOINTMENT.  [phone] AND  PLEASE NOTIFY NUCLEAR MEDICINE AT [facility] AT  LEAST 24 HOURS IN ADVANCE IF YOU ARE UNABLE TO KEEP YOUR APPOINTMENT. [phone]  How to prepare for your Myoview test:  1. Do not eat or drink after midnight 2. No caffeine for 24 hours prior to test 3. No smoking 24 hours prior to test. 4. Your medication may be taken with water.  If your doctor stopped a medication because of this test, do not take that medication. 5. Ladies, please do not wear dresses.  Skirts or pants are appropriate. Please wear a short sleeve shirt. 6. No perfume, cologne or lotion. 7. Wear comfortable walking shoes. No heels!

## 2017-09-27 ENCOUNTER — Telehealth: Payer: Self-pay | Admitting: Cardiovascular Disease

## 2017-09-27 ENCOUNTER — Telehealth: Payer: Self-pay | Admitting: Physician Assistant

## 2017-09-27 NOTE — Telephone Encounter (Signed)
Patient's daughter is calling because when patient was in for office visit Fabio Bering told him that she was going to refer him to our Education officer, museum and they have not heard anything yet.  Please advise.

## 2017-09-27 NOTE — Telephone Encounter (Signed)
Left voicemail message to call back  

## 2017-09-27 NOTE — Telephone Encounter (Signed)
[facility] calling stating patient no showed his Myoview   FYI

## 2017-09-30 NOTE — Telephone Encounter (Signed)
I left a message on the patient's voice mail at this preferred # (OK per DPR) that he had missed his scheduled stress test for 09/27/17. I asked that he call back to reschedule or let us know if he does not want to proceed with the stress test. I asked that he let us know either way.

## 2017-09-30 NOTE — Telephone Encounter (Signed)
Just checking on the status of this patient and if the referral has been received, thank you.

## 2017-10-01 NOTE — Telephone Encounter (Signed)
DSS said he has a $5802.00 deductible to meet every 6 mths and daughter has submitted bills but worker has not had chance to add them up.  They are able to pay his part B medicare which can help with hospital bills, etc...  He should be eligible for part D which would cover meds and I will make sure family knows about it.  If he is not eligible for medicaid ongoing they can always apply every two years when bills reach the deductible amount but he might be eligible for ongoing medicaid unless he meets deductible every 6 mths.  I also have call into [place] to see if she has any other ideas as well.  Will let you know outcome! Thanks, Amy

## 2017-10-01 NOTE — Telephone Encounter (Signed)
Thank you so much for your care of this mutual patient. Will continue to follow on my end.

## 2017-10-02 ENCOUNTER — Telehealth: Payer: Self-pay

## 2017-10-02 ENCOUNTER — Other Ambulatory Visit: Payer: Self-pay

## 2017-10-02 NOTE — Telephone Encounter (Signed)
Patient advised as directed below.  Thanks,  -Joseline 

## 2017-10-02 NOTE — Telephone Encounter (Signed)
-----   Message from Trinna Post, [place] sent at 10/02/2017  2:08 PM EDT ----- Ultrasound does seem to show some mild fatty change but otherwise liver is not enlarged and does not show other signs of cirrhosis.

## 2017-10-02 NOTE — Telephone Encounter (Signed)
Left detailed voicemail message to call back regarding missed stress test so that I can assist with getting that rescheduled.

## 2017-10-03 DIAGNOSIS — R0989 Other specified symptoms and signs involving the circulatory and respiratory systems: Secondary | ICD-10-CM

## 2017-10-03 NOTE — Telephone Encounter (Signed)
Left detailed voicemail message that we were calling to assist with rescheduling his missed appointment and to call back when they are ready to get this scheduled.

## 2017-10-17 ENCOUNTER — Ambulatory Visit: Admission: RE | Admit: 2017-10-17 | Payer: Medicaid Other | Source: Ambulatory Visit

## 2017-10-17 ENCOUNTER — Telehealth (INDEPENDENT_AMBULATORY_CARE_PROVIDER_SITE_OTHER): Payer: Self-pay | Admitting: Vascular Surgery

## 2017-10-17 NOTE — Telephone Encounter (Signed)
Radiology was called and I gave them the information from the patient's message that he had an emergency to come up and to cancel his CT and he would call radiology to reschedule his CT.

## 2017-10-21 ENCOUNTER — Ambulatory Visit (INDEPENDENT_AMBULATORY_CARE_PROVIDER_SITE_OTHER): Payer: Self-pay | Admitting: Physician Assistant

## 2017-10-21 ENCOUNTER — Encounter: Payer: Self-pay | Admitting: Physician Assistant

## 2017-10-21 VITALS — BP 128/68 | HR 84 | Temp 98.0°F | Resp 16 | Wt 130.0 lb

## 2017-10-21 DIAGNOSIS — N529 Male erectile dysfunction, unspecified: Secondary | ICD-10-CM

## 2017-10-21 DIAGNOSIS — Z720 Tobacco use: Secondary | ICD-10-CM

## 2017-10-21 DIAGNOSIS — F1010 Alcohol abuse, uncomplicated: Secondary | ICD-10-CM

## 2017-10-21 DIAGNOSIS — J449 Chronic obstructive pulmonary disease, unspecified: Secondary | ICD-10-CM

## 2017-10-21 DIAGNOSIS — H9193 Unspecified hearing loss, bilateral: Secondary | ICD-10-CM

## 2017-10-21 DIAGNOSIS — E039 Hypothyroidism, unspecified: Secondary | ICD-10-CM

## 2017-10-21 DIAGNOSIS — I739 Peripheral vascular disease, unspecified: Secondary | ICD-10-CM

## 2017-10-21 MED ORDER — ALBUTEROL SULFATE HFA 108 (90 BASE) MCG/ACT IN AERS: 2.0000 | INHALATION_SPRAY | Freq: Four times a day (QID) | RESPIRATORY_TRACT | 1 refills | Status: DC | PRN

## 2017-10-21 MED ORDER — FLUTICASONE-UMECLIDIN-VILANT 100-62.5-25 MCG/INH IN AEPB: 1.0000 | INHALATION_SPRAY | Freq: Every day | RESPIRATORY_TRACT | 1 refills | Status: DC

## 2017-10-21 NOTE — Progress Notes (Signed)
Patient: Dylan Ramos Male    DOB: 1955/10/16   62 y.o.   MRN: [id number] Visit Date: 10/22/2017  Today's Provider: Trinna Post, PA-C   Chief Complaint  Patient presents with  . Hypothyroidism   Subjective:   Hearing Loss Needs referral today as he reports he has changed name of provider on Medicaid card and would like to be evaluated for this at ENT.  Cataract Surgery These are currently scheduled for 10/29/2017 and 11/05/2017. He has since been to cardiology for evaluation - Dr. Rockey Situ placed him on Aspirin and statin, which he says he is taking. EKG showed NSR. Dr. Rockey Situ ordered stress test prior to surgery for preoperative clearance as well as echocardiogram. Patient says he does not intend to get this because he cannot lay flat for multiple hours and also he was instructed to not eat or drink anything prior and he cannot go without drinking alcohol for that long.  PAD Cancelled scan on 10/17/2017. Says he is getting it tomorrow.  Alcohol Abuse Recent RUQ US showed some mild fatty change but no focal lesions or nodularity suggesting cirrhosis. Single liver enzyme elevation. Patient reports he has decreased drinking beer and increased drinking liquor. He does report he eats hamburgers three times daily and has gained five pounds.  Erectile Dysfunction  Requesting medication for this.  Thyroid Problem  Presents for follow-up (Pt recently started levothyroxine 64mcg 1 daily.) visit. Patient reports no anxiety, cold intolerance, constipation, depressed mood, diaphoresis, diarrhea, dry skin, fatigue, hair loss, heat intolerance, hoarse voice, leg swelling, nail problem, palpitations, tremors, visual change, weight gain or weight loss. The symptoms have been stable.       No Known Allergies   Current Outpatient Medications:  .  albuterol (PROVENTIL HFA;VENTOLIN HFA) 108 (90 Base) MCG/ACT inhaler, Inhale 2 puffs into the lungs every 6 (six) hours as needed for wheezing or  shortness of breath., Disp: 18 g, Rfl: 1 .  aspirin EC 81 MG tablet, Take 1 tablet (81 mg total) by mouth daily., Disp: 90 tablet, Rfl: 3 .  atorvastatin (LIPITOR) 10 MG tablet, Take 1 tablet (10 mg total) by mouth daily., Disp: 90 tablet, Rfl: 3 .  DULoxetine (CYMBALTA) 30 MG capsule, Take 1 capsule (30 mg total) by mouth daily., Disp: 90 capsule, Rfl: 0 .  fluticasone (FLOVENT HFA) 110 MCG/ACT inhaler, Inhale into the lungs., Disp: , Rfl:  .  Fluticasone-Umeclidin-Vilant (TRELEGY ELLIPTA) 100-62.5-25 MCG/INH AEPB, Inhale 1 puff into the lungs daily., Disp: 28 each, Rfl: 1 .  ibuprofen (ADVIL,MOTRIN) 800 MG tablet, Take 800 mg by mouth every 8 (eight) hours as needed., Disp: , Rfl:  .  levothyroxine (SYNTHROID, LEVOTHROID) 25 MCG tablet, Take 0.5 tablets (12.5 mcg total) by mouth daily before breakfast., Disp: 45 tablet, Rfl: 0  Review of Systems  Constitutional: Negative.  Negative for diaphoresis, fatigue, weight gain and weight loss.  HENT: Negative for hoarse voice.   Respiratory: Negative.   Cardiovascular: Negative.  Negative for palpitations.  Gastrointestinal: Negative.  Negative for constipation and diarrhea.  Endocrine: Negative.  Negative for cold intolerance and heat intolerance.  Neurological: Negative for dizziness, tremors, light-headedness and headaches.  Psychiatric/Behavioral: The patient is not nervous/anxious.     Social History   Tobacco Use  . Smoking status: Current Every Day Smoker    Packs/day: 2.00    Years: 52.00    Pack years: 104.00    Types: Cigarettes  . Smokeless tobacco: Never Used  Substance Use Topics  . Alcohol use: Yes    Comment: 12 pack of beer daily & 1 gallon on liquor monthly.    Objective:   BP 128/68 (BP Location: Right Arm, Patient Position: Sitting, Cuff Size: Normal)   Pulse 84   Temp 98 F (36.7 C) (Oral)   Resp 16   Wt 130 lb (59 kg)   BMI 19.77 kg/m  Vitals:   10/21/17 1341  BP: 128/68  Pulse: 84  Resp: 16  Temp: 98 F  (36.7 C)  TempSrc: Oral  Weight: 130 lb (59 kg)     Physical Exam  Constitutional: He is oriented to person, place, and time. He appears well-developed and well-nourished.  Chronically ill appearing man.   Neck: No thyromegaly present.  Cardiovascular: Normal rate and regular rhythm.  Pulmonary/Chest: Effort normal and breath sounds normal.  Neurological: He is alert and oriented to person, place, and time.  Skin: Skin is warm and dry.  Psychiatric: He has a normal mood and affect. His behavior is normal.        Assessment & Plan:     1. Chronic obstructive pulmonary disease, unspecified COPD type (HCC)  - albuterol (PROVENTIL HFA;VENTOLIN HFA) 108 (90 Base) MCG/ACT inhaler; Inhale 2 puffs into the lungs every 6 (six) hours as needed for wheezing or shortness of breath.  Dispense: 18 g; Refill: 1 - Fluticasone-Umeclidin-Vilant (TRELEGY ELLIPTA) 100-62.5-25 MCG/INH AEPB; Inhale 1 puff into the lungs daily.  Dispense: 28 each; Refill: 1  2. Hypothyroidism, unspecified type  Will adjust levothyroxine as necessary.  - TSH  3. Bilateral hearing loss, unspecified hearing loss type  - Ambulatory referral to ENT  4. PAD (peripheral artery disease) ([place])  Has been to see cardiology, taking aspirin and statin. Has cancelled CT scan for vascular surgery. I am not sure how he will be cleared for cataract surgery without cardiac testing, which he says he is unable to complete. Encourage him to follow up with appropriate specialists.  5. Nicotine abuse  Continues, no desire to stop.  6. Alcohol abuse  Continues, no desire to stop.  7. Erectile dysfunction, unspecified erectile dysfunction type  Counseled that his vascular status is extremely poor and this includes any blood supply to penis. Have counseled that medications will likely not work. Also concerned for dangerous hypotension due to drinking and falls prior. Also, he has taken viagra in past and had to be seen in the ER  for erection lasting longer than four hours.   Return in about 1 month (around 11/20/2017).  The entirety of the information documented in the History of Present Illness, Review of Systems and Physical Exam were personally obtained by me. Portions of this information were initially documented by Ashley Royalty, CMA and reviewed by me for thoroughness and accuracy.          Trinna Post, PA-C  [facility]

## 2017-10-22 ENCOUNTER — Ambulatory Visit
Admission: RE | Admit: 2017-10-22 | Discharge: 2017-10-22 | Disposition: A | Discharge status: Home / Self Care | Payer: Medicare Other | Source: Ambulatory Visit | Attending: Vascular Surgery | Admitting: Vascular Surgery

## 2017-10-22 ENCOUNTER — Telehealth: Payer: Self-pay | Admitting: *Deleted

## 2017-10-22 ENCOUNTER — Ambulatory Visit: Payer: Self-pay

## 2017-10-22 DIAGNOSIS — I70299 Other atherosclerosis of native arteries of extremities, unspecified extremity: Secondary | ICD-10-CM | POA: Insufficient documentation

## 2017-10-22 DIAGNOSIS — I700 Atherosclerosis of aorta: Secondary | ICD-10-CM | POA: Insufficient documentation

## 2017-10-22 DIAGNOSIS — I719 Aortic aneurysm of unspecified site, without rupture: Secondary | ICD-10-CM | POA: Diagnosis not present

## 2017-10-22 DIAGNOSIS — L97909 Non-pressure chronic ulcer of unspecified part of unspecified lower leg with unspecified severity: Secondary | ICD-10-CM | POA: Insufficient documentation

## 2017-10-22 HISTORY — DX: Unspecified asthma, uncomplicated: J45.909

## 2017-10-22 LAB — TSH: TSH: 8.45 u[IU]/mL — ABNORMAL HIGH (ref 0.450–4.500)

## 2017-10-22 LAB — POCT I-STAT CREATININE: Creatinine, Ser: 1 mg/dL (ref 0.61–1.24)

## 2017-10-22 MED ORDER — IOPAMIDOL (ISOVUE-370) INJECTION 76%
125.0000 mL | Freq: Once | INTRAVENOUS | Status: AC | PRN
  Administered 2017-10-22: 125 mL via INTRAVENOUS

## 2017-10-22 NOTE — Telephone Encounter (Signed)
Attempted to contact patient regarding prior no show for lung screening scan. Patient's daughter reports patient is not available but is not interested in having a lung screening scan.

## 2017-10-23 ENCOUNTER — Encounter: Payer: Self-pay | Admitting: Cardiovascular Disease

## 2017-10-24 ENCOUNTER — Encounter: Payer: Self-pay | Admitting: Cardiovascular Disease

## 2017-10-24 ENCOUNTER — Encounter (INDEPENDENT_AMBULATORY_CARE_PROVIDER_SITE_OTHER): Payer: Self-pay

## 2017-10-24 ENCOUNTER — Encounter (INDEPENDENT_AMBULATORY_CARE_PROVIDER_SITE_OTHER): Payer: Self-pay | Admitting: Vascular Surgery

## 2017-10-24 ENCOUNTER — Ambulatory Visit (INDEPENDENT_AMBULATORY_CARE_PROVIDER_SITE_OTHER): Payer: Medicare Other | Admitting: Vascular Surgery

## 2017-10-24 VITALS — BP 118/69 | HR 98 | Resp 15 | Ht 68.0 in | Wt 125.4 lb

## 2017-10-24 DIAGNOSIS — M79606 Pain in leg, unspecified: Secondary | ICD-10-CM | POA: Diagnosis not present

## 2017-10-24 DIAGNOSIS — J449 Chronic obstructive pulmonary disease, unspecified: Secondary | ICD-10-CM

## 2017-10-24 DIAGNOSIS — E782 Mixed hyperlipidemia: Secondary | ICD-10-CM

## 2017-10-24 DIAGNOSIS — I70223 Atherosclerosis of native arteries of extremities with rest pain, bilateral legs: Secondary | ICD-10-CM | POA: Diagnosis not present

## 2017-10-24 DIAGNOSIS — R1900 Intra-abdominal and pelvic swelling, mass and lump, unspecified site: Secondary | ICD-10-CM

## 2017-10-25 ENCOUNTER — Telehealth: Payer: Self-pay

## 2017-10-25 ENCOUNTER — Other Ambulatory Visit: Payer: Self-pay | Admitting: Physician Assistant

## 2017-10-25 ENCOUNTER — Telehealth: Payer: Self-pay | Admitting: Physician Assistant

## 2017-10-25 DIAGNOSIS — E039 Hypothyroidism, unspecified: Secondary | ICD-10-CM

## 2017-10-25 MED ORDER — LEVOTHYROXINE SODIUM 50 MCG PO TABS: 50.0000 ug | ORAL_TABLET | Freq: Every day | ORAL | 2 refills | Status: DC

## 2017-10-25 NOTE — Telephone Encounter (Signed)
[name] advised.  Please send levothyroxine to [facility] in [place].    Thanks,   -Mickel Baas

## 2017-10-25 NOTE — Telephone Encounter (Signed)
Pt. Daughter, [name], wants TSH results. He is running low on Levothyroxine and didn't know if you needed to change dosing.  Please leave a message on Barbara's VM.

## 2017-10-25 NOTE — Telephone Encounter (Signed)
[name] advised of lab results.   Thanks,   -Mickel Baas

## 2017-10-25 NOTE — Telephone Encounter (Signed)
-----   Message from Trinna Post, [place] sent at 10/25/2017 12:19 PM EDT ----- 50 mcg tabs of levothyroxine have been sent in.

## 2017-10-27 ENCOUNTER — Encounter (INDEPENDENT_AMBULATORY_CARE_PROVIDER_SITE_OTHER): Payer: Self-pay | Admitting: Vascular Surgery

## 2017-10-27 DIAGNOSIS — E785 Hyperlipidemia, unspecified: Secondary | ICD-10-CM | POA: Insufficient documentation

## 2017-10-27 DIAGNOSIS — R1900 Intra-abdominal and pelvic swelling, mass and lump, unspecified site: Secondary | ICD-10-CM | POA: Insufficient documentation

## 2017-10-27 NOTE — Progress Notes (Signed)
MRN : [id number]  Dylan Ramos is a 62 y.o. (1955-09-28) male who presents with chief complaint of  Chief Complaint  Patient presents with  . Follow-up    ct results  .  History of Present Illness: The patient returns to the office for followup and review of the CT scan. There has been a significant deterioration in the lower extremity symptoms.  The patient notes interval shortening of their claudication distance and development of rest pain symptoms. No new ulcers or wounds have occurred since the last visit.  There have been no significant changes to the patient's overall health care.  The patient denies amaurosis fugax or recent TIA symptoms. There are no recent neurological changes noted. The patient denies history of DVT, PE or superficial thrombophlebitis. The patient denies recent episodes of angina or shortness of breath.   CT scan is reviewed by me personally and shown to the patient and his family.  He has extensive disease at multiple levels.  Previously placed iliac stents appear to be patent.  Incidental finding of a pelvic mass with thickening of the sigmoid in association with diffuse diverticulosis.  She does have a history of a complicated bout of diverticulitis a year or 2 ago.   Current Meds  Medication Sig  . albuterol (PROVENTIL HFA;VENTOLIN HFA) 108 (90 Base) MCG/ACT inhaler Inhale 2 puffs into the lungs every 6 (six) hours as needed for wheezing or shortness of breath.  Marland Kitchen aspirin EC 81 MG tablet Take 1 tablet (81 mg total) by mouth daily.  Marland Kitchen atorvastatin (LIPITOR) 10 MG tablet Take 1 tablet (10 mg total) by mouth daily.  . DULoxetine (CYMBALTA) 30 MG capsule Take 1 capsule (30 mg total) by mouth daily.  . fluticasone (FLOVENT HFA) 110 MCG/ACT inhaler Inhale into the lungs.  . Fluticasone-Umeclidin-Vilant (TRELEGY ELLIPTA) 100-62.5-25 MCG/INH AEPB Inhale 1 puff into the lungs daily.  Marland Kitchen ibuprofen (ADVIL,MOTRIN) 800 MG tablet Take 800 mg by mouth every 8 (eight)  hours as needed.  . [DISCONTINUED] levothyroxine (SYNTHROID, LEVOTHROID) 25 MCG tablet Take 0.5 tablets (12.5 mcg total) by mouth daily before breakfast.    Past Medical History:  Diagnosis Date  . Alcohol abuse   . Asthma   . COPD (chronic obstructive pulmonary disease) ([place])   . Depression   . Disease of the aorta ([place])   . Enlarged liver   . Hypothyroidism   . Mini stroke ([place])   . Peripheral arterial disease ([place])   . Skin cancer   . Tobacco abuse     Past Surgical History:  Procedure Laterality Date  . ILIAC ARTERY STENT     Right common and external iliac artery stent placement    Social History Social History   Tobacco Use  . Smoking status: Current Every Day Smoker    Packs/day: 2.00    Years: 52.00    Pack years: 104.00    Types: Cigarettes  . Smokeless tobacco: Never Used  Substance Use Topics  . Alcohol use: Yes    Comment: 12 pack of beer daily & 1 gallon on liquor monthly.   . Drug use: Yes    Types: Marijuana    Comment: monthly     Family History Family History  Problem Relation Age of Onset  . Heart failure Mother   . Hypertension Mother   . Hyperlipidemia Mother   . Heart attack Mother   . Heart disease Mother   . Cancer - Cervical Father   . Cirrhosis  Brother   . Clotting disorder Brother     No Known Allergies   REVIEW OF SYSTEMS (Negative unless checked)  Constitutional: [] Weight loss  [] Fever  [] Chills Cardiac: [] Chest pain   [] Chest pressure   [] Palpitations   [] Shortness of breath when laying flat   [] Shortness of breath with exertion. Vascular:  [x] Pain in legs with walking   [x] Pain in legs at rest  [] History of DVT   [] Phlebitis   [x] Swelling in legs   [] Varicose veins   [] Non-healing ulcers Pulmonary:   [] Uses home oxygen   [] Productive cough   [] Hemoptysis   [] Wheeze  [] COPD   [] Asthma Neurologic:  [] Dizziness   [] Seizures   [] History of stroke   [] History of TIA  [] Aphasia   [] Vissual changes   [] Weakness or numbness in arm    [] Weakness or numbness in leg Musculoskeletal:   [] Joint swelling   [] Joint pain   [] Low back pain Hematologic:  [] Easy bruising  [] Easy bleeding   [] Hypercoagulable state   [] Anemic Gastrointestinal:  [] Diarrhea   [] Vomiting  [] Gastroesophageal reflux/heartburn   [] Difficulty swallowing. Genitourinary:  [] Chronic kidney disease   [] Difficult urination  [] Frequent urination   [] Blood in urine Skin:  [] Rashes   [] Ulcers  Psychological:  [] History of anxiety   []  History of major depression.  Physical Examination  Vitals:   10/24/17 1311  BP: 118/69  Pulse: 98  Resp: 15  Weight: 125 lb 6.4 oz (56.9 kg)  Height: 5\' 8"  (1.727 m)   Body mass index is 19.07 kg/m. Gen: WD/WN, NAD Head: [place]/AT, No temporalis wasting.  Ear/Nose/Throat: Hearing grossly intact, nares w/o erythema or drainage Eyes: PER, EOMI, sclera nonicteric.  Neck: Supple, no large masses.   Pulmonary:  Good air movement, no audible wheezing bilaterally, no use of accessory muscles.  Cardiac: RRR, no JVD Vascular:  2-3+ soft pitting edema bilateral legs Vessel Right Left  Radial Palpable Palpable  PT Not Palpable Not Palpable  DP Not Palpable Not Palpable  Gastrointestinal: Non-distended. No guarding/no peritoneal signs.  Musculoskeletal: M/S 5/5 throughout.  No deformity or atrophy.  Neurologic: CN 2-12 intact. Symmetrical.  Speech is fluent. Motor exam as listed above. Psychiatric: Judgment intact, Mood & affect appropriate for pt's clinical situation. Dermatologic: No rashes or ulcers noted.  No changes consistent with cellulitis. Lymph : No lichenification or skin changes of chronic lymphedema.  CBC Lab Results  Component Value Date   WBC 7.4 08/01/2017   HGB 13.6 08/01/2017   HCT 38.7 08/01/2017   MCV 101 (H) 08/01/2017   PLT 132 (L) 08/01/2017    BMET    Component Value Date/Time   NA 128 (L) 09/19/2017 1157   K 3.5 09/19/2017 1157   CL 90 (L) 09/19/2017 1157   CO2 22 09/19/2017 1157   GLUCOSE 91  09/19/2017 1157   GLUCOSE 86 02/18/2017 1915   BUN 6 (L) 09/19/2017 1157   CREATININE 1.00 10/22/2017 1550   CALCIUM 9.0 09/19/2017 1157   GFRNONAA 94 09/19/2017 1157   GFRAA 108 09/19/2017 1157   Estimated Creatinine Clearance: 61.6 mL/min (by C-G formula based on SCr of 1 mg/dL).  COAG No results found for: INR, PROTIME  Radiology Ct Angio Ao+bifem W & Or Wo Contrast  Result Date: 10/23/2017 CLINICAL DATA:  62 year old male with a history vascular disease. EXAM: CT ANGIOGRAPHY OF ABDOMINAL AORTA WITH ILIOFEMORAL RUNOFF TECHNIQUE: Multidetector CT imaging of the abdomen, pelvis and lower extremities was performed using the standard protocol during bolus administration of intravenous  contrast. Multiplanar CT image reconstructions and MIPs were obtained to evaluate the vascular anatomy. CONTRAST:  110mL ISOVUE-370 IOPAMIDOL (ISOVUE-370) INJECTION 76% COMPARISON:  None. FINDINGS: VASCULAR Aorta: Moderate to advanced atherosclerotic changes of the visualized aorta with mixed calcified and soft plaque. The greatest diameter of the visualized thoracic aorta at the hiatus measures 2.3 cm. Greatest diameter of the abdominal aorta is just inferior to the renal arteries at the site of likely chronic dissection, with a transverse diameter of 2.9 cm. No periaortic fluid. The chronic dissection is limited to approximately 1 cm segment (image 88 of series 5). Diameter of the aorta just above the IMA origin measures 2.4 cm. Celiac: Celiac artery stenotic at the origin, likely secondary to compression from the diaphragmatic crus. No significant calcified disease. SMA: Moderate atherosclerotic changes at the SMA origin with perhaps 50% narrowing. Branch vessels are patent. Renals: Single right renal artery with moderate atherosclerotic changes at the origin. Single left renal artery with moderate atherosclerotic changes at the origin. IMA: IMA is patent Right lower extremity: Atherosclerotic changes of the native  right iliac system with patent stent system from the common iliac artery origin to the external iliac artery, terminating just above the origin of the inferior epigastric artery. Stent system crosses the hypogastric artery which is occluded at the origin, with pelvic reconstitution. No evidence of in stent stenosis or occlusion. Common femoral artery is patent with mild atherosclerotic changes. Profunda femoris is patent. Advanced atherosclerotic changes of the right superficial femoral artery, with diffuse disease including occlusion in the abductor canal. Reconstitution of the popliteal artery at the knee with mild atherosclerotic changes of the popliteal artery. Anterior tibial artery appears patent from the origin to the ankle. Tibioperoneal trunk patent. Proximal peroneal artery and posterior tibial artery are patent proximally, with decreased contrast distally, potentially occluded at the ankle. Left lower extremity: Moderate to advanced atherosclerotic changes of the left iliac system without significant stenosis. No aneurysm. No inflammatory changes. Hypogastric artery is patent. Common femoral artery demonstrates high bifurcation. Profunda femoris patent. Moderate atherosclerotic changes of the left superficial femoral artery without CT evidence of occlusion or significant stenosis. Worst degree of disease within the abductor canal. Popliteal artery is patent. Tibioperoneal trunk patent. The proximal anterior tibial artery, posterior tibial artery, and peroneal artery are patent. There is no contrast within the anterior tibial artery at the ankle. No contrast within the peroneal artery at the ankle. Posterior tibial artery is patent from the origin to the ankle. Veins: Unremarkable appearance of the venous system. Review of the MIP images confirms the above findings. NON-VASCULAR Lower chest: Motion artifact somewhat limits evaluation of lung bases, however, no confluent airspace disease, pneumothorax or  pleural effusion. Likely changes of emphysema. Hiatal hernia. Hepatobiliary: Unremarkable appearance of the liver. Unremarkable gall bladder. Pancreas: Unremarkable appearance of the pancreas. No pericholecystic fluid or inflammatory changes. Unremarkable ductal system. Spleen: Unremarkable. Adrenals/Urinary Tract: Unremarkable appearance of adrenal glands. Right: No hydronephrosis. Symmetric perfusion to the left. No nephrolithiasis. Unremarkable course of the right ureter. Left: No hydronephrosis. Symmetric perfusion to the right. No nephrolithiasis. Unremarkable course of the left ureter. Urinary bladder unremarkable, partially distended Stomach/Bowel: Hiatal hernia. Unremarkable stomach. Unremarkable appearance of small bowel. No evidence of obstruction. Normal appendix. Diverticular change throughout the length of the colon. There is circumferential wall thickening of the sigmoid colon. There is a linear configuration of gas which appears outside of the sigmoid colon lumen, projecting inferior to the sigmoid though continuous with the colonic wall (image  series [id number] of series 5). No evidence of abscess. Lymphatic: No pelvic lymphadenopathy. Mild edema/hazy infiltration of the fat adjacent to the sigmoid colon. No periaortic/preaortic lymphadenopathy. Reproductive: Unremarkable appearance of the pelvic organs. Other: No hernia. Musculoskeletal: No acute displaced fracture. Degenerative changes of the visualized thoracolumbar spine. Most advanced narrowing at L4-L5 where there is broad-based disc bulge and facet change. Degenerative changes at the hips and knees. 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 Aortic Atherosclerosis (ICD10-I70.0). Aortic aneurysm NOS (ICD10-I71.9). Ectatic abdominal aorta at risk for aneurysm development.  Recommend followup by ultrasound in 5 years. This recommendation follows ACR consensus guidelines: White Paper of the ACR Incidental Findings Committee II on Vascular Findings. J Am Coll Radiol 2013; 10:789-794. - right iliac disease with patent iliac stent system and no evidence of in stent stenosis. -advanced right femoropopliteal disease with diffuse SFA disease including chronic total occlusion in the adductor canal. Reconstitution of the popliteal artery via collateral - mild right tibial disease, with patent anterior tibial artery and questionable patency of distal posterior tibial artery and peroneal artery. - moderate to advanced left iliac disease with no occlusion or evidence of stenosis. -moderate left femoropopliteal disease, and moderate left tibial disease, with in-line flow to the ankle via posterior tibial artery. Questionable patency of the left anterior tibial artery and peroneal artery at the ankle. Mesenteric and bilateral renal artery disease, without evidence of high-grade stenosis. Severe diverticular change throughout the length of the colon. There is linear lucency/gas adjacent to the sigmoid colon, associated with the colonic wall. This may reflect chronic diverticulitis, or a previous, undiagnosed perforation. No evidence of abscess formation. Correlation with symptoms of diverticulitis and referral for colonoscopy, if not already performed, is recommended as sigmoid malignancy cannot be excluded on the CT. These results were called by telephone at the time of interpretation on 10/23/2017 at 1:09 pm to Dr. Hortencia Pilar , who verbally acknowledged these results. Signed, Dulcy Fanny. Earleen Newport, DO [facility] Radiology Electronically Signed   By: Corrie Mckusick D.O.   On: 10/23/2017 13:09      Assessment/Plan 1. Atherosclerosis of native artery of both lower extremities with rest pain ([place]) Recommend:  The patient has evidence of severe  atherosclerotic changes of both lower extremities with rest pain that is associated with preulcerative changes and impending tissue loss of the foot.  This represents a limb threatening ischemia and places the patient at the risk for limb loss.  Patient should undergo angiography of the lower extremities with the hope for intervention for limb salvage.  His right leg is worse than his left and will be the first one to be angio'ed.    The risks and benefits as well as the alternative therapies was discussed in detail with the patient.  All questions were answered.  Patient agrees to proceed with angiography.  The patient will follow up with me in the office after the procedure.     2. Pelvic mass in male CT scan is reviewed by me personally and shown to the patient and his family.  He has extensive disease at multiple levels.  Previously placed iliac stents appear to be patent.  Incidental finding of a pelvic mass with thickening of the sigmoid in association with diffuse diverticulosis.  She does have a history of a complicated bout of diverticulitis a year or 2 ago.  I will ask GI to see him and decide the next step to evaluate this mass  - Ambulatory referral to Gastroenterology  3. Chronic obstructive pulmonary disease, unspecified COPD type ([place]) Continue pulmonary medications and aerosols as already ordered, these medications have been reviewed and there are no changes at this time.    4. Pain of lower extremity, unspecified laterality See #1  5. Mixed hyperlipidemia Continue statin as ordered and reviewed, no changes at this time     Hortencia Pilar, MD  10/27/2017 1:50 PM

## 2017-10-29 ENCOUNTER — Encounter: Payer: Self-pay | Admitting: Gastroenterology

## 2017-10-30 ENCOUNTER — Telehealth: Payer: Self-pay | Admitting: Cardiovascular Disease

## 2017-10-30 NOTE — Telephone Encounter (Signed)
Left detailed voicemail message with instructions below for upcoming myoview. Instructed her to call back if any further questions.    1. Do not eat or drink after midnight 2. No caffeine for 24 hours prior to test 3. No smoking 24 hours prior to test. 4. Your medication may be taken with water.  If your doctor stopped a medication because of this test, do not take that medication. 5. Ladies, please do not wear dresses.  Skirts or pants are appropriate. Please wear a short sleeve shirt. 6. No perfume, cologne or lotion. 7. Wear comfortable walking shoes. No heels!

## 2017-10-30 NOTE — Telephone Encounter (Signed)
Pt daughter called rescheduling his Myoview  He is scheduled for 11/08/17   Please call back with instructions

## 2017-11-04 ENCOUNTER — Other Ambulatory Visit (INDEPENDENT_AMBULATORY_CARE_PROVIDER_SITE_OTHER): Payer: Self-pay | Admitting: Vascular Surgery

## 2017-11-06 ENCOUNTER — Other Ambulatory Visit: Payer: Self-pay | Admitting: Cardiovascular Disease

## 2017-11-06 ENCOUNTER — Telehealth: Payer: Self-pay | Admitting: Gastroenterology

## 2017-11-06 ENCOUNTER — Ambulatory Visit: Payer: Medicare Other | Admitting: Gastroenterology

## 2017-11-06 ENCOUNTER — Encounter (INDEPENDENT_AMBULATORY_CARE_PROVIDER_SITE_OTHER): Payer: Self-pay

## 2017-11-06 DIAGNOSIS — R0600 Dyspnea, unspecified: Secondary | ICD-10-CM

## 2017-11-06 DIAGNOSIS — R079 Chest pain, unspecified: Secondary | ICD-10-CM

## 2017-11-06 NOTE — Telephone Encounter (Signed)
Patient was a no show for a confirmed appt. Patients daughter stated he doesn't want to come in and will be canceling all medical appt.

## 2017-11-08 ENCOUNTER — Ambulatory Visit: Payer: Medicare Other

## 2017-11-11 ENCOUNTER — Inpatient Hospital Stay: Admission: RE | Admit: 2017-11-11 | Payer: Self-pay | Source: Ambulatory Visit

## 2017-11-19 ENCOUNTER — Other Ambulatory Visit: Payer: Self-pay

## 2017-11-25 ENCOUNTER — Other Ambulatory Visit (INDEPENDENT_AMBULATORY_CARE_PROVIDER_SITE_OTHER): Payer: Self-pay | Admitting: Vascular Surgery

## 2017-11-25 ENCOUNTER — Encounter: Payer: Self-pay | Admitting: Cardiovascular Disease

## 2017-12-02 ENCOUNTER — Inpatient Hospital Stay: Admission: RE | Admit: 2017-12-02 | Payer: Self-pay | Source: Ambulatory Visit

## 2017-12-02 MED ORDER — CEFAZOLIN SODIUM-DEXTROSE 2-4 GM/100ML-% IV SOLN: 2.0000 g | Freq: Once | INTRAVENOUS | Status: DC

## 2017-12-03 ENCOUNTER — Encounter: Admission: RE | Payer: Self-pay | Source: Ambulatory Visit

## 2017-12-03 ENCOUNTER — Ambulatory Visit: Admission: RE | Admit: 2017-12-03 | Payer: Medicare Other | Source: Ambulatory Visit | Admitting: Vascular Surgery

## 2017-12-03 ENCOUNTER — Ambulatory Visit: Payer: Self-pay | Admitting: Physician Assistant

## 2017-12-03 SURGERY — LOWER EXTREMITY ANGIOGRAPHY
Anesthesia: Moderate Sedation | Laterality: Right

## 2017-12-19 ENCOUNTER — Other Ambulatory Visit: Payer: Self-pay | Admitting: Physician Assistant

## 2017-12-19 DIAGNOSIS — E039 Hypothyroidism, unspecified: Secondary | ICD-10-CM

## 2017-12-19 DIAGNOSIS — F329 Major depressive disorder, single episode, unspecified: Secondary | ICD-10-CM

## 2017-12-19 DIAGNOSIS — F32A Depression, unspecified: Secondary | ICD-10-CM

## 2017-12-27 NOTE — Telephone Encounter (Signed)
Needs to get TSH lab so I can adjust synthroid.

## 2018-01-02 NOTE — Telephone Encounter (Signed)
Left message advising pt he is due for TSH.   Thanks,   -Mickel Baas

## 2018-02-14 NOTE — Telephone Encounter (Signed)
OK to try to schedule 1 more time, but if this attempt is unsuccessful would remove from the work que.

## 2018-02-14 NOTE — Telephone Encounter (Signed)
Patient no show for April 2019 appt for myoview.  Last ov in March.  Is it ok to contact one more time to schedule or do they needs an ov first? Mailed Letter in April .  Can myoview order be removed from wq ?

## 2018-02-16 ENCOUNTER — Other Ambulatory Visit: Payer: Self-pay | Admitting: Physician Assistant

## 2018-02-16 DIAGNOSIS — J449 Chronic obstructive pulmonary disease, unspecified: Secondary | ICD-10-CM

## 2018-02-21 ENCOUNTER — Ambulatory Visit: Payer: Self-pay | Admitting: Physician Assistant

## 2018-02-21 NOTE — Telephone Encounter (Signed)
Line rings busy x 2 .  Mailed Letter . Removing from WQ.

## 2018-02-28 ENCOUNTER — Ambulatory Visit (INDEPENDENT_AMBULATORY_CARE_PROVIDER_SITE_OTHER): Payer: Medicare Other | Admitting: Physician Assistant

## 2018-02-28 ENCOUNTER — Encounter: Payer: Self-pay | Admitting: Physician Assistant

## 2018-02-28 VITALS — BP 138/78 | HR 60 | Temp 97.8°F | Wt 134.0 lb

## 2018-02-28 DIAGNOSIS — E782 Mixed hyperlipidemia: Secondary | ICD-10-CM | POA: Diagnosis not present

## 2018-02-28 DIAGNOSIS — R21 Rash and other nonspecific skin eruption: Secondary | ICD-10-CM

## 2018-02-28 DIAGNOSIS — E039 Hypothyroidism, unspecified: Secondary | ICD-10-CM | POA: Diagnosis not present

## 2018-02-28 DIAGNOSIS — F329 Major depressive disorder, single episode, unspecified: Secondary | ICD-10-CM | POA: Diagnosis not present

## 2018-02-28 DIAGNOSIS — F32A Depression, unspecified: Secondary | ICD-10-CM

## 2018-02-28 MED ORDER — ATORVASTATIN CALCIUM 10 MG PO TABS: 10.0000 mg | ORAL_TABLET | Freq: Every day | ORAL | 3 refills | Status: AC

## 2018-02-28 MED ORDER — DULOXETINE HCL 30 MG PO CPEP: 30.0000 mg | ORAL_CAPSULE | Freq: Every day | ORAL | 0 refills | Status: DC

## 2018-02-28 MED ORDER — PERMETHRIN 5 % EX CREA: TOPICAL_CREAM | CUTANEOUS | 0 refills | Status: DC

## 2018-02-28 MED ORDER — LEVOTHYROXINE SODIUM 50 MCG PO TABS: 50.0000 ug | ORAL_TABLET | Freq: Every day | ORAL | 0 refills | Status: DC

## 2018-02-28 NOTE — Progress Notes (Signed)
Patient: Dylan Ramos Male    DOB: Sep 12, 1955   62 y.o.   MRN: [id number] Visit Date: 03/06/2018  Today's Provider: Trinna Post, PA-C   Chief Complaint  Patient presents with  . Hypothyroidism   Subjective:    HPI  Following up today for chronic illnesses.  Hypothyroidism: Uncontrolled, hasn't been able to come to clinic due to transportation and isn't taking thyroid medications.   Lab Results  Component Value Date   TSH 24.560 (H) 02/28/2018   COPD: Trelegy + albuterol. Continues to smoke.   Depression: Out of cymbalta.  HLD/CVD: Lipitor 10 mg daily, needs refills  Itching: Reports itching x 2 months. Reports it is especially worse at night. He lives in a boarding facility. Girlfriend has similar symptoms. Reports rash on arms and legs.     No Known Allergies   Current Outpatient Medications:  .  albuterol (PROVENTIL HFA;VENTOLIN HFA) 108 (90 Base) MCG/ACT inhaler, INHALE 2 PUFFS INTO THE LUNGS EVERY 6 HOURS AS NEEDED FOR WHEEZING OR SHORTNESS OF BREATH, Disp: 54 g, Rfl: 1 .  aspirin EC 81 MG tablet, Take 1 tablet (81 mg total) by mouth daily., Disp: 90 tablet, Rfl: 3 .  atorvastatin (LIPITOR) 10 MG tablet, Take 1 tablet (10 mg total) by mouth daily., Disp: 90 tablet, Rfl: 3 .  DULoxetine (CYMBALTA) 30 MG capsule, Take 1 capsule (30 mg total) by mouth daily., Disp: 90 capsule, Rfl: 0 .  fluticasone (FLOVENT HFA) 110 MCG/ACT inhaler, Inhale into the lungs., Disp: , Rfl:  .  Fluticasone-Umeclidin-Vilant (TRELEGY ELLIPTA) 100-62.5-25 MCG/INH AEPB, Inhale 1 puff into the lungs daily., Disp: 28 each, Rfl: 1 .  ibuprofen (ADVIL,MOTRIN) 800 MG tablet, Take 800 mg by mouth every 8 (eight) hours as needed., Disp: , Rfl:  .  ketorolac (ACULAR) 0.5 % ophthalmic solution, Instill 1 drop into the operative eye 3 times per day for 1 month beginning after surgery, Disp: , Rfl:  .  levothyroxine (SYNTHROID, LEVOTHROID) 50 MCG tablet, Take 1 tablet (50 mcg total) by mouth  daily before breakfast., Disp: 90 tablet, Rfl: 0 .  ofloxacin (OCUFLOX) 0.3 % ophthalmic solution, Instill 1 drop into the surgical eye 3 times per day for 1 week beginning after surgery., Disp: , Rfl:  .  prednisoLONE acetate (PRED FORTE) 1 % ophthalmic suspension, Instill 1 drop into the surgical eye 3 times per day beginning after surgery., Disp: , Rfl:  .  permethrin (ELIMITE) 5 % cream, Apply head to toe and leave on for 8 hours., Disp: 60 g, Rfl: 0  Review of Systems  Constitutional: Negative.   Respiratory: Positive for shortness of breath. Negative for apnea, cough, choking, chest tightness, wheezing and stridor.   Cardiovascular: Positive for leg swelling. Negative for chest pain and palpitations.  Gastrointestinal: Positive for abdominal pain. Negative for abdominal distention, anal bleeding, blood in stool, constipation, diarrhea, nausea, rectal pain and vomiting.  Skin: Negative for color change, pallor, rash and wound.  Neurological: Negative for dizziness, light-headedness and headaches.  Psychiatric/Behavioral: Positive for sleep disturbance (Pt report he is up all night itching.). Negative for agitation, behavioral problems, confusion, decreased concentration, dysphoric mood, hallucinations, self-injury and suicidal ideas. The patient is nervous/anxious. The patient is not hyperactive.     Social History   Tobacco Use  . Smoking status: Current Every Day Smoker    Packs/day: 2.00    Years: 52.00    Pack years: 104.00    Types: Cigarettes  .  Smokeless tobacco: Never Used  Substance Use Topics  . Alcohol use: Yes    Comment: 12 pack of beer daily & 1 gallon on liquor monthly.    Objective:   BP 138/78 (BP Location: Right Arm, Patient Position: Sitting, Cuff Size: Normal)   Pulse 60   Temp 97.8 F (36.6 C) (Oral)   Wt 134 lb (60.8 kg)   SpO2 95%   BMI 20.37 kg/m  Vitals:   02/28/18 1500  BP: 138/78  Pulse: 60  Temp: 97.8 F (36.6 C)  TempSrc: Oral  SpO2: 95%    Weight: 134 lb (60.8 kg)     Physical Exam  Constitutional: He is oriented to person, place, and time. He appears well-developed and well-nourished.  Cardiovascular: Normal rate and regular rhythm.  Pulmonary/Chest: Effort normal and breath sounds normal.  Neurological: He is alert and oriented to person, place, and time.  Skin: Skin is warm and dry.  Erythematous papules on arms and legs.   Psychiatric: He has a normal mood and affect. His behavior is normal.        Assessment & Plan:     1. Rash  Suspect scabies, treat as below.  - permethrin (ELIMITE) 5 % cream; Apply head to toe and leave on for 8 hours.  Dispense: 60 g; Refill: 0  2. Depression, unspecified depression type  - DULoxetine (CYMBALTA) 30 MG capsule; Take 1 capsule (30 mg total) by mouth daily.  Dispense: 90 capsule; Refill: 0  3. Hypothyroidism, unspecified type  - levothyroxine (SYNTHROID, LEVOTHROID) 50 MCG tablet; Take 1 tablet (50 mcg total) by mouth daily before breakfast.  Dispense: 90 tablet; Refill: 0  4. Mixed hyperlipidemia  - atorvastatin (LIPITOR) 10 MG tablet; Take 1 tablet (10 mg total) by mouth daily.  Dispense: 90 tablet; Refill: 3 - Comprehensive Metabolic Panel (CMET) - Lipid Profile  Return in about 3 months (around 05/31/2018).  The entirety of the information documented in the History of Present Illness, Review of Systems and Physical Exam were personally obtained by me. Portions of this information were initially documented by Ashley Royalty, CMA and reviewed by me for thoroughness and accuracy.         Trinna Post, PA-C  [facility]

## 2018-03-01 LAB — COMPREHENSIVE METABOLIC PANEL
ALT: 15 IU/L (ref 0–44)
AST: 55 IU/L — ABNORMAL HIGH (ref 0–40)
Albumin/Globulin Ratio: 0.8 — ABNORMAL LOW (ref 1.2–2.2)
Albumin: 3.3 g/dL — ABNORMAL LOW (ref 3.6–4.8)
Alkaline Phosphatase: 133 IU/L — ABNORMAL HIGH (ref 39–117)
BUN/Creatinine Ratio: 6 — ABNORMAL LOW (ref 10–24)
BUN: 5 mg/dL — ABNORMAL LOW (ref 8–27)
Bilirubin Total: 0.4 mg/dL (ref 0.0–1.2)
CO2: 21 mmol/L (ref 20–29)
Calcium: 8.7 mg/dL (ref 8.6–10.2)
Chloride: 99 mmol/L (ref 96–106)
Creatinine, Ser: 0.87 mg/dL (ref 0.76–1.27)
GFR calc Af Amer: 107 mL/min/{1.73_m2} (ref >59)
GFR calc non Af Amer: 92 mL/min/{1.73_m2} (ref >59)
Globulin, Total: 3.9 g/dL (ref 1.5–4.5)
Glucose: 80 mg/dL (ref 65–99)
Potassium: 4.1 mmol/L (ref 3.5–5.2)
Sodium: 135 mmol/L (ref 134–144)
Total Protein: 7.2 g/dL (ref 6.0–8.5)

## 2018-03-01 LAB — LIPID PANEL
Chol/HDL Ratio: 2.6 ratio (ref 0.0–5.0)
Cholesterol, Total: 104 mg/dL (ref 100–199)
HDL: 40 mg/dL (ref >39)
LDL Calculated: 48 mg/dL (ref 0–99)
Triglycerides: 81 mg/dL (ref 0–149)
VLDL Cholesterol Cal: 16 mg/dL (ref 5–40)

## 2018-03-06 NOTE — Patient Instructions (Signed)
Hypothyroidism Hypothyroidism is a disorder of the thyroid. The thyroid is a large gland that is located in the lower front of the neck. The thyroid releases hormones that control how the body works. With hypothyroidism, the thyroid does not make enough of these hormones. What are the causes? Causes of hypothyroidism may include:  Viral infections.  Pregnancy.  Your own defense system (immune system) attacking your thyroid.  Certain medicines.  Birth defects.  Past radiation treatments to your head or neck.  Past treatment with radioactive iodine.  Past surgical removal of part or all of your thyroid.  Problems with the gland that is located in the center of your brain (pituitary).  What are the signs or symptoms? Signs and symptoms of hypothyroidism may include:  Feeling as though you have no energy (lethargy).  Inability to tolerate cold.  Weight gain that is not explained by a change in diet or exercise habits.  Dry skin.  Coarse hair.  Menstrual irregularity.  Slowing of thought processes.  Constipation.  Sadness or depression.  How is this diagnosed? Your health care provider may diagnose hypothyroidism with blood tests and ultrasound tests. How is this treated? Hypothyroidism is treated with medicine that replaces the hormones that your body does not make. After you begin treatment, it may take several weeks for symptoms to go away. Follow these instructions at home:  Take medicines only as directed by your health care provider.  If you start taking any new medicines, tell your health care provider.  Keep all follow-up visits as directed by your health care provider. This is important. As your condition improves, your dosage needs may change. You will need to have blood tests regularly so that your health care provider can watch your condition. Contact a health care provider if:  Your symptoms do not get better with treatment.  You are taking thyroid  replacement medicine and: ? You sweat excessively. ? You have tremors. ? You feel anxious. ? You lose weight rapidly. ? You cannot tolerate heat. ? You have emotional swings. ? You have diarrhea. ? You feel weak. Get help right away if:  You develop chest pain.  You develop an irregular heartbeat.  You develop a rapid heartbeat. This information is not intended to replace advice given to you by your health care provider. Make sure you discuss any questions you have with your health care provider. Document Released: 07/02/2005 Document Revised: 12/08/2015 Document Reviewed: 11/17/2013 Elsevier Interactive Patient Education  2018 [employer]  

## 2018-03-07 ENCOUNTER — Telehealth: Payer: Self-pay

## 2018-03-07 NOTE — Telephone Encounter (Signed)
Patient's daughter advised. Per DPR

## 2018-03-07 NOTE — Telephone Encounter (Signed)
Patient's daughter advised. Okay per [facility]

## 2018-03-07 NOTE — Telephone Encounter (Signed)
-----  Message from Trinna Post, [place] sent at 03/04/2018 10:36 AM EDT ----- Alk phos worse, likely due to drinking. Cholesterol stable. I forgot to add his TSH, can we add under dx hypothyroidism? Thank you.

## 2018-03-07 NOTE — Telephone Encounter (Signed)
-----   Message from Trinna Post, [place] sent at 03/05/2018  3:01 PM EDT ----- TSH high, please take levothyroxine that I sent in.

## 2018-03-08 LAB — TSH: TSH: 24.56 u[IU]/mL — ABNORMAL HIGH (ref 0.450–4.500)

## 2018-03-08 LAB — SPECIMEN STATUS REPORT

## 2018-03-24 ENCOUNTER — Other Ambulatory Visit: Payer: Self-pay | Admitting: Physician Assistant

## 2018-03-24 DIAGNOSIS — R21 Rash and other nonspecific skin eruption: Secondary | ICD-10-CM

## 2018-03-24 DIAGNOSIS — J449 Chronic obstructive pulmonary disease, unspecified: Secondary | ICD-10-CM

## 2018-04-07 ENCOUNTER — Other Ambulatory Visit: Payer: Self-pay | Admitting: Physician Assistant

## 2018-04-07 DIAGNOSIS — E039 Hypothyroidism, unspecified: Secondary | ICD-10-CM

## 2018-04-07 MED ORDER — LEVOTHYROXINE SODIUM 50 MCG PO TABS: 50.0000 ug | ORAL_TABLET | Freq: Every day | ORAL | 0 refills | Status: AC

## 2018-04-07 NOTE — Telephone Encounter (Signed)
Pt needs refill on   Levothyroxine [address]  CB#  [phone]  Thank s Con Memos

## 2018-04-21 ENCOUNTER — Other Ambulatory Visit: Payer: Self-pay | Admitting: Physician Assistant

## 2018-04-21 DIAGNOSIS — J449 Chronic obstructive pulmonary disease, unspecified: Secondary | ICD-10-CM

## 2018-05-20 ENCOUNTER — Other Ambulatory Visit: Payer: Self-pay | Admitting: Physician Assistant

## 2018-05-20 DIAGNOSIS — F32A Depression, unspecified: Secondary | ICD-10-CM

## 2018-05-20 DIAGNOSIS — F329 Major depressive disorder, single episode, unspecified: Secondary | ICD-10-CM

## 2018-05-30 NOTE — Progress Notes (Deleted)
Established Patient Office Visit  Subjective:  Patient ID: Dylan Ramos, male    DOB: 1955-09-06  Age: 62 y.o. MRN: [id number]  CC: No chief complaint on file.   HPI   Problem List    COPD (chronic obstructive pulmonary disease) ([place])   Overview    60 pack year smoking history.      Current Assessment & Plan    Continue trelegy.       Relevant Medications   fluticasone (FLOVENT HFA) 110 MCG/ACT inhaler   albuterol (PROVENTIL HFA;VENTOLIN HFA) 108 (90 Base) MCG/ACT inhaler   TRELEGY ELLIPTA 100-62.5-25 MCG/INH AEPB      Dylan Ramos presents for ***  Past Medical History:  Diagnosis Date  . Alcohol abuse   . Asthma   . COPD (chronic obstructive pulmonary disease) ([name])   . Depression   . Disease of the aorta ([name])   . Enlarged liver   . Hypothyroidism   . Mini stroke ([name])   . Peripheral arterial disease ([name])   . Skin cancer   . Tobacco abuse     Past Surgical History:  Procedure Laterality Date  . ILIAC ARTERY STENT     Right common and external iliac artery stent placement    Family History  Problem Relation Age of Onset  . Heart failure Mother   . Hypertension Mother   . Hyperlipidemia Mother   . Heart attack Mother   . Heart disease Mother   . Cancer - Cervical Father   . Cirrhosis Brother   . Clotting disorder Brother     Social History   Socioeconomic History  . Marital status: Widowed    Spouse name: Not on file  . Number of children: Not on file  . Years of education: Not on file  . Highest education level: Not on file  Occupational History  . Not on file  Social Needs  . Financial resource strain: Not on file  . Food insecurity:    Worry: Not on file    Inability: Not on file  . Transportation needs:    Medical: Not on file    Non-medical: Not on file  Tobacco Use  . Smoking status: Current Every Day Smoker    Packs/day: 2.00    Years: 52.00    Pack years: 104.00    Types: Cigarettes  . Smokeless tobacco: Never Used    Substance and Sexual Activity  . Alcohol use: Yes    Comment: 12 pack of beer daily & 1 gallon on liquor monthly.   . Drug use: Yes    Types: Marijuana    Comment: monthly   . Sexual activity: Not on file  Lifestyle  . Physical activity:    Days per week: Not on file    Minutes per session: Not on file  . Stress: Not on file  Relationships  . Social connections:    Talks on phone: Not on file    Gets together: Not on file    Attends religious service: Not on file    Active member of club or organization: Not on file    Attends meetings of clubs or organizations: Not on file    Relationship status: Not on file  . Intimate partner violence:    Fear of current or ex partner: Not on file    Emotionally abused: Not on file    Physically abused: Not on file    Forced sexual activity: Not on file  Other Topics  Concern  . Not on file  Social History Narrative  . Not on file    Outpatient Medications Prior to Visit  Medication Sig Dispense Refill  . albuterol (PROVENTIL HFA;VENTOLIN HFA) 108 (90 Base) MCG/ACT inhaler INHALE 2 PUFFS INTO THE LUNGS EVERY 6 HOURS AS NEEDED FOR WHEEZING OR SHORTNESS OF BREATH 18 g 0  . aspirin EC 81 MG tablet Take 1 tablet (81 mg total) by mouth daily. 90 tablet 3  . atorvastatin (LIPITOR) 10 MG tablet Take 1 tablet (10 mg total) by mouth daily. 90 tablet 3  . DULoxetine (CYMBALTA) 30 MG capsule TAKE 1 CAPSULE (30 MG TOTAL) BY MOUTH DAILY. 90 capsule 0  . fluticasone (FLOVENT HFA) 110 MCG/ACT inhaler Inhale into the lungs.    Marland Kitchen ibuprofen (ADVIL,MOTRIN) 800 MG tablet Take 800 mg by mouth every 8 (eight) hours as needed.    Marland Kitchen ketorolac (ACULAR) 0.5 % ophthalmic solution Instill 1 drop into the operative eye 3 times per day for 1 month beginning after surgery    . levothyroxine (SYNTHROID, LEVOTHROID) 50 MCG tablet Take 1 tablet (50 mcg total) by mouth daily before breakfast. 90 tablet 0  . ofloxacin (OCUFLOX) 0.3 % ophthalmic solution Instill 1 drop into  the surgical eye 3 times per day for 1 week beginning after surgery.    . permethrin (ELIMITE) 5 % cream APPLY HEAD TO TOE AND LEAVE ON FOR 8 HOURS. 60 g 0  . prednisoLONE acetate (PRED FORTE) 1 % ophthalmic suspension Instill 1 drop into the surgical eye 3 times per day beginning after surgery.    . TRELEGY ELLIPTA 100-62.5-25 MCG/INH AEPB INHALE 1 PUFF INTO THE LUNGS DAILY 28 each 1   No facility-administered medications prior to visit.     No Known Allergies  ROS Review of Systems    Objective:    Physical Exam  There were no vitals taken for this visit. Wt Readings from Last 3 Encounters:  02/28/18 134 lb (60.8 kg)  10/24/17 125 lb 6.4 oz (56.9 kg)  10/21/17 130 lb (59 kg)     Health Maintenance Due  Topic Date Due  . Hepatitis C Screening  [birth year]  . HIV Screening  07/10/1970  . TETANUS/TDAP  07/10/1974  . COLONOSCOPY  07/10/2005  . INFLUENZA VACCINE  02/13/2018    There are no preventive care reminders to display for this patient.  Lab Results  Component Value Date   TSH 24.560 (H) 02/28/2018   Lab Results  Component Value Date   WBC 7.4 08/01/2017   HGB 13.6 08/01/2017   HCT 38.7 08/01/2017   MCV 101 (H) 08/01/2017   PLT 132 (L) 08/01/2017   Lab Results  Component Value Date   NA 135 02/28/2018   K 4.1 02/28/2018   CO2 21 02/28/2018   GLUCOSE 80 02/28/2018   BUN 5 (L) 02/28/2018   CREATININE 0.87 02/28/2018   BILITOT 0.4 02/28/2018   ALKPHOS 133 (H) 02/28/2018   AST 55 (H) 02/28/2018   ALT 15 02/28/2018   PROT 7.2 02/28/2018   ALBUMIN 3.3 (L) 02/28/2018   CALCIUM 8.7 02/28/2018   ANIONGAP 10 02/18/2017   Lab Results  Component Value Date   CHOL 104 02/28/2018   Lab Results  Component Value Date   HDL 40 02/28/2018   Lab Results  Component Value Date   LDLCALC 48 02/28/2018   Lab Results  Component Value Date   TRIG 81 02/28/2018   Lab Results  Component Value Date  CHOLHDL 2.6 02/28/2018   No results found for:  HGBA1C    Assessment & Plan:   Problem List Items Addressed This Visit      Respiratory   COPD (chronic obstructive pulmonary disease) ([place])    Continue trelegy.          No orders of the defined types were placed in this encounter.   Follow-up: No follow-ups on file.    Trinna Post, PA-C

## 2018-05-30 NOTE — Assessment & Plan Note (Deleted)
Continue trelegy.  

## 2018-06-02 ENCOUNTER — Ambulatory Visit: Payer: Self-pay | Admitting: Physician Assistant

## 2018-06-25 ENCOUNTER — Ambulatory Visit: Payer: Self-pay | Admitting: Physician Assistant

## 2018-07-24 ENCOUNTER — Ambulatory Visit: Payer: Self-pay | Admitting: Physician Assistant

## 2018-07-29 ENCOUNTER — Ambulatory Visit: Payer: Self-pay | Admitting: Physician Assistant

## 2018-07-29 ENCOUNTER — Telehealth: Payer: Self-pay | Admitting: Physician Assistant

## 2018-07-29 NOTE — Telephone Encounter (Signed)
Pt doesn't drive.  Pt has missed past appts because daughters make appts for him and do not come to take him to appts. He wanted to let Adriana know.  He has made a new appt on Thurs at 11am.  He says he will make this appt.  Thanks, [employer]

## 2018-07-30 NOTE — Telephone Encounter (Signed)
Noted, thanks!

## 2018-07-31 ENCOUNTER — Ambulatory Visit: Payer: Self-pay | Admitting: Physician Assistant

## 2019-03-11 ENCOUNTER — Telehealth: Payer: Self-pay

## 2019-03-11 NOTE — Telephone Encounter (Signed)
Please advise? There is nothing in pt's chart.

## 2019-03-11 NOTE — Telephone Encounter (Signed)
Patients daughter [name] called reqeusting to speak with Fabio Bering or her nurse. She states the patient died this past 2022-11-13, and she wants to know what the cause of death was. Please advise.

## 2019-03-11 NOTE — Telephone Encounter (Signed)
She is not on DPR and we cannot release information to her.

## 2019-03-11 NOTE — Telephone Encounter (Signed)
No answer and no vm

## 2019-03-12 ENCOUNTER — Telehealth: Payer: Self-pay | Admitting: Physician Assistant

## 2019-03-12 ENCOUNTER — Telehealth: Payer: Self-pay

## 2019-03-12 NOTE — Telephone Encounter (Signed)
Patients middle daughter Grover Canavan called wanting to know what the cause of death was for her father. Call back is [phone]) [id number]. She is on DPR. She requested to speak with Fabio Bering, and says that Fabio Bering knows her.

## 2019-03-12 NOTE — Telephone Encounter (Signed)
Please advise 

## 2019-03-12 NOTE — Telephone Encounter (Signed)
Just FYI, no action necessary.   I spoke with daughter Donnetta Hail who is on the patient's DPR. I had her confirm her address and her father's date of birth. I received a message from the morgue a few days ago that they were trying to contact his family and had so far been unable to. Apart from that, I have not received communication about his manner of death and I have not been asked to sign a death certificate. Counseled Sharyn Lull that sometimes when a person a person dies in the community a medical examiner will sign their death certificate. Daughter reports he was found at 7:00 PM but appeared to have died earlier. She reports her sister got a call that the patient is ready to pick up from the morgue and in that case, it sounds like the medical examiner has signed the death certificate and we may not know exactly what caused his death. Based on his medical history and circumstances surrounding his death, I would imagine he had a heart attack, but I cannot be sure. Offered my condolences. Daughter expressed gratitude.

## 2019-03-13 ENCOUNTER — Telehealth: Payer: Self-pay

## 2019-03-13 NOTE — Telephone Encounter (Signed)
LMOVM for toni to return call.

## 2019-03-13 NOTE — Telephone Encounter (Signed)
Please see other note, a message has been left. I do not know which daughter is calling. Dylan Lull Vivien Rota) Ramos is on the [employer] whereas Dylan Ramos is not.  I have not been contacted at all about a death certificate. Generally a Nurse, learning disability will bring the death certificate to me if those arrangements have been made. However, I was also told the body is in a morgue and I am not sure if a medical examiner has signed the death certificate or is in the process of investigating anything. They should discuss that with the morgue.    To sum up:   1. I have not been contacted about a death certificate. If or when I am, I will fill it out for the appropriate party, which is typically the Nurse, learning disability. 2. There is nothing I can do to make this process go faster.

## 2019-03-13 NOTE — Telephone Encounter (Signed)
Patient's daughter is calling to get information about her father's death certificate.

## 2019-03-13 NOTE — Telephone Encounter (Signed)
Left message notifying Dylan Ramos that we have not receive anything concerning pt's death. No death certificate has been left at our office.

## 2019-03-13 NOTE — Telephone Encounter (Signed)
Patients daughter Vivien Rota had called the office requesting status of patients death certificate? Patient daughter states that she needs it before end of morning and is requesting a call back ASAP. She can be reached at [phone]. KW

## 2019-03-16 NOTE — Telephone Encounter (Signed)
Patient's daughter Sharyn Lull states that the hospital advised them that Dr. Caryn Section should have the death certificate.  FYI

## 2019-03-16 NOTE — Telephone Encounter (Signed)
I have asked him and he does not. We will be on the lookout for it.

## 2019-03-16 NOTE — Telephone Encounter (Signed)
Spoke with daughter.  She called the funeral home and they have the death certificate and are waiting on the family to some and sign paperwork before they will release the certificate.  The daughter doesn't want any information released to Patsy (who is on the [facility]) .

## 2019-03-17 DEATH — deceased

## 2019-03-18 ENCOUNTER — Telehealth: Payer: Self-pay | Admitting: Physician Assistant

## 2019-03-18 NOTE — Telephone Encounter (Signed)
Death certificate fax and funeral home advised.   Thanks,   -Mickel Baas

## 2019-03-18 NOTE — Telephone Encounter (Signed)
Vivien Rota advised the death certificate was faxed to the funeral home. Also a representative is going to pick up the original copy.   Thanks,   -Mickel Baas

## 2019-03-18 NOTE — Telephone Encounter (Signed)
Pt's daughter Vivien Rota called wanting to know if we have received her dads death cert.  CB#  [phone]  Con Memos

## 2019-03-18 NOTE — Telephone Encounter (Signed)
Death certificate arrived in my basket on morning of 03/18/2019. I have signed and completed this at 8:38 AM and placed for fax. Please let Fernande Bras) know, she is listed on DPR.

## 2019-06-16 IMAGING — US US ABDOMEN LIMITED
1 series · 14 of 25 positions shown · non-contrast
Comparison: None.

CLINICAL DATA: Enlarged liver.  History of alcohol abuse.

EXAM:
ULTRASOUND ABDOMEN LIMITED RIGHT UPPER QUADRANT

[Series 1: us abdomen limited · 0.20mm/px · 14 of 60 slices shown]
[im 1/60]
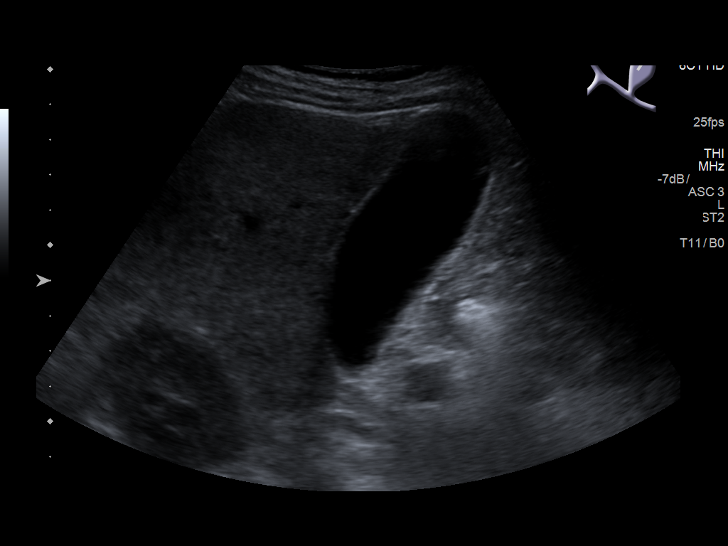
[im 5/60]
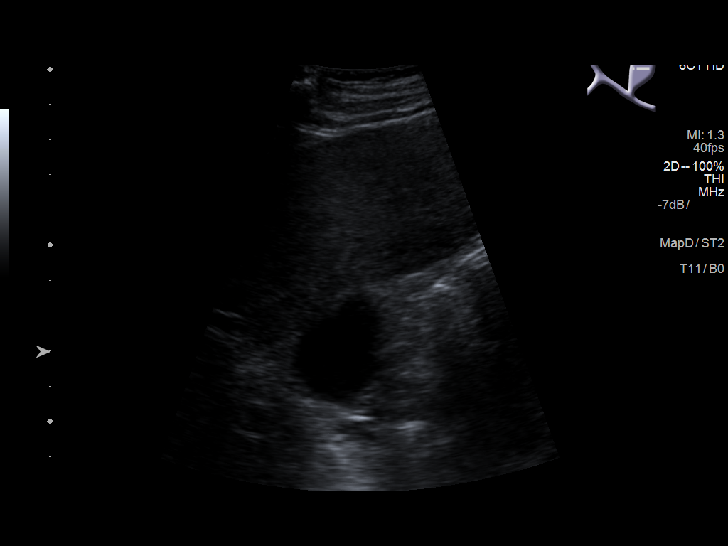
[im 10/60]
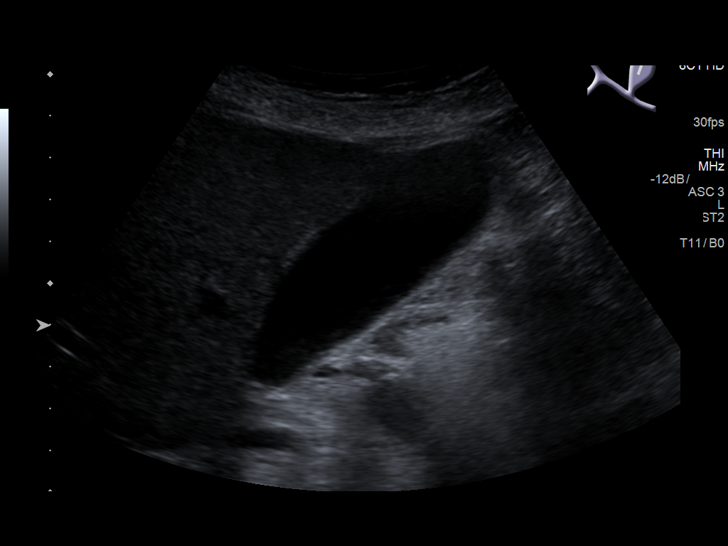
[im 15/60]
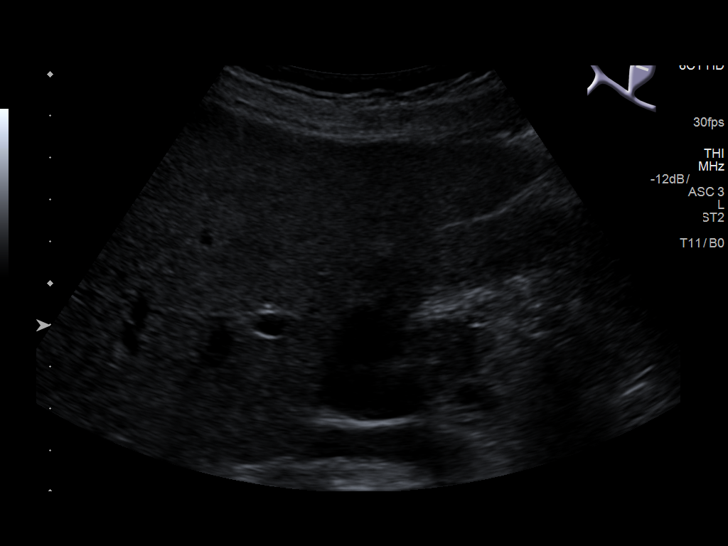
[im 20/60]
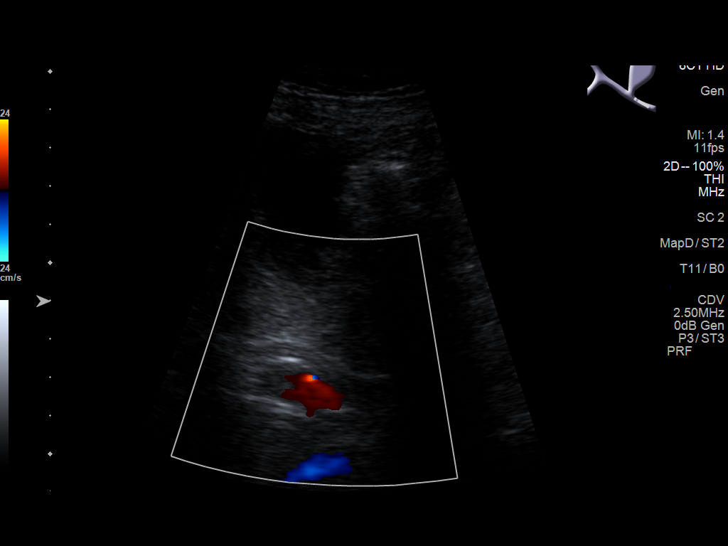
[im 23/60]
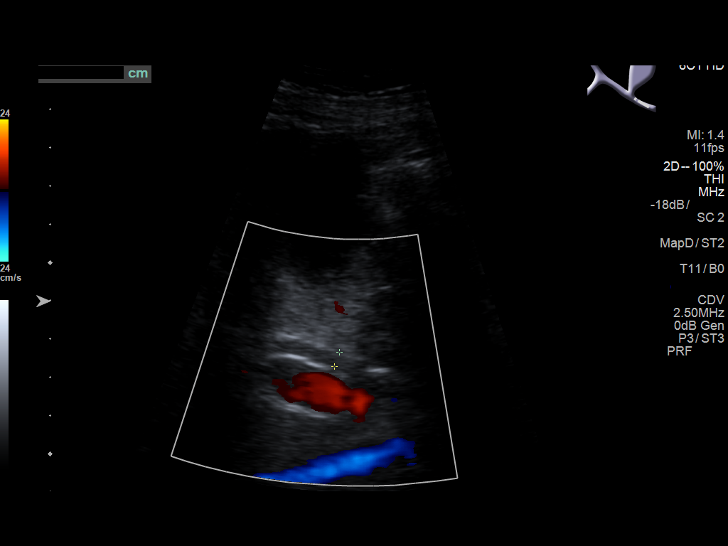
[im 28/60]
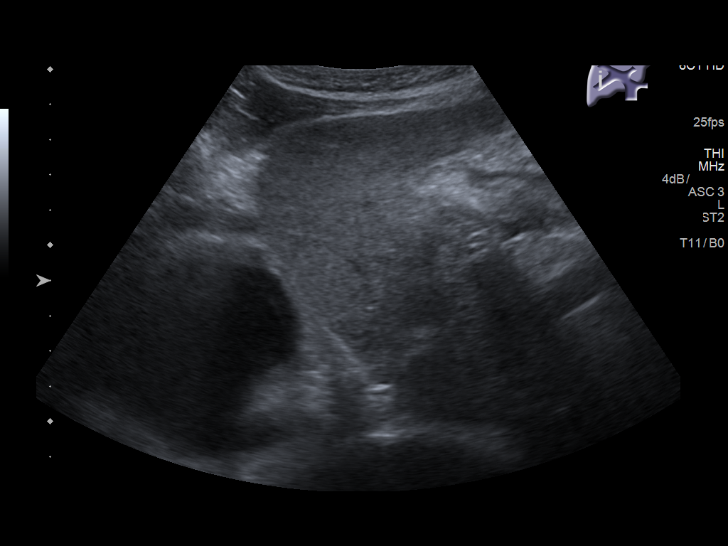
[im 32/60]
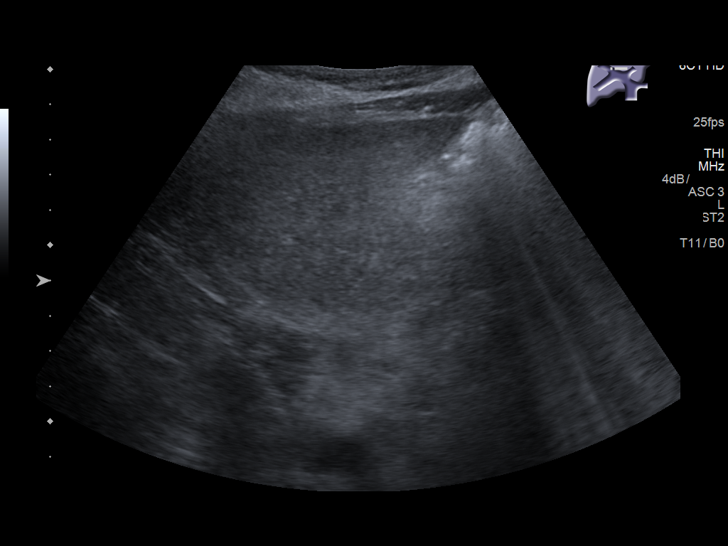
[im 37/60]
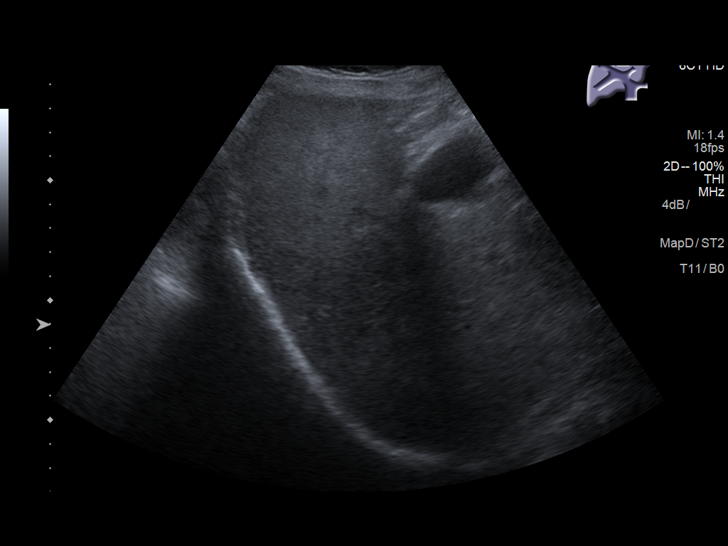
[im 40/60]
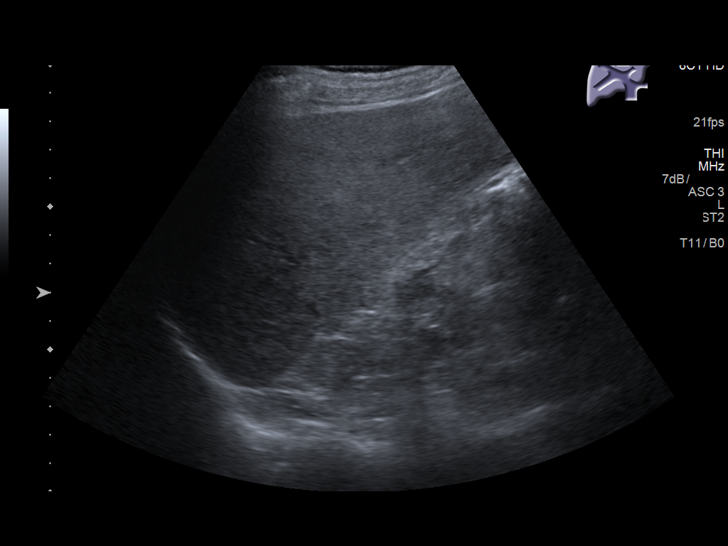
[im 45/60]
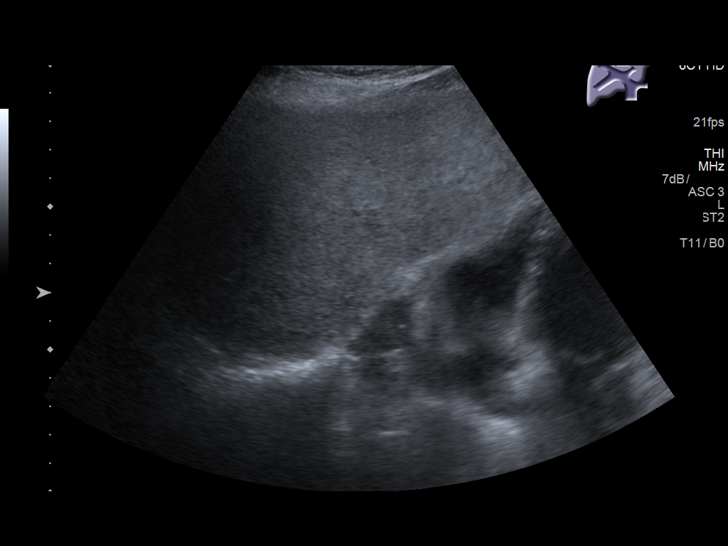
[im 50/60]
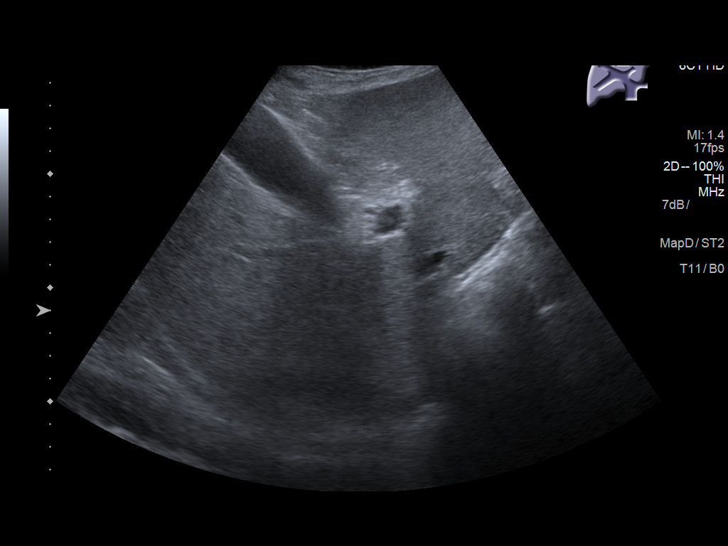
[im 55/60]
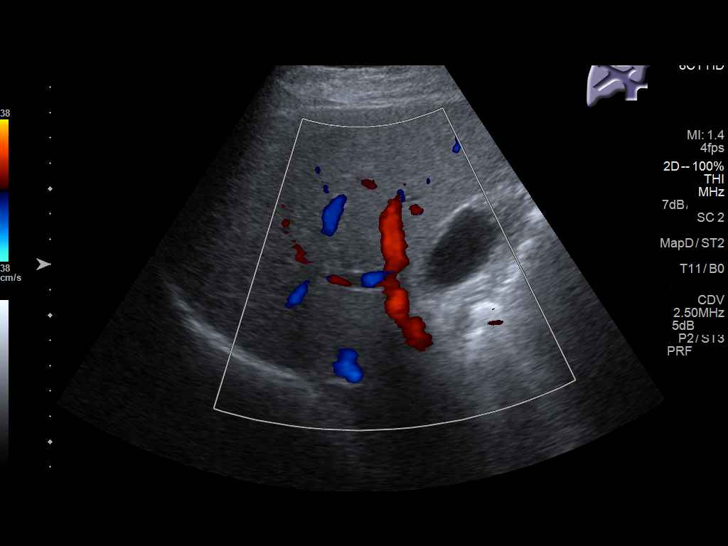
[im 60/60]
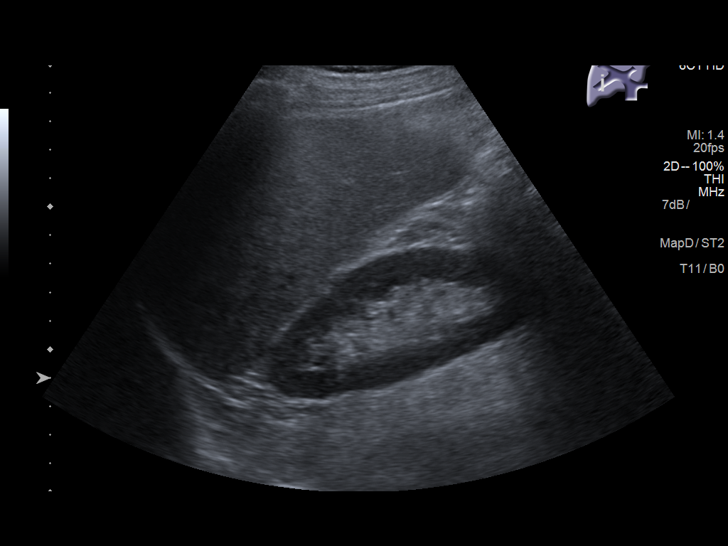

[14 of 25 positions shown; findings below may reference images not displayed]

FINDINGS: Gallbladder:

No gallstones or wall thickening visualized. No sonographic Murphy
sign noted by sonographer.

Common bile duct:

Diameter: 3.9 mm, normal

Liver:

Mildly echogenic liver suggesting mild fatty change. No evidence of
focal lesion. No evidence of organomegaly. Portal vein is patent on
color Doppler imaging with normal direction of blood flow towards
the liver.
IMPRESSION: Study is within normal limits with exception of slight echogenicity
of the liver which could indicate mild fatty change. Liver size
appears within normal limits.

## 2020-01-25 IMAGING — CT CT ANGIO AOBIFEM WO/W CM
2 of 12 series · 8 of 46 positions shown, 13 images · IV contrast (iopamidol)
Comparison: None.

CLINICAL DATA: 61-year-old male with a history vascular disease.

EXAM:
CT ANGIOGRAPHY OF ABDOMINAL AORTA WITH ILIOFEMORAL RUNOFF
TECHNIQUE: Multidetector CT imaging of the abdomen, pelvis and lower
extremities was performed using the standard protocol during bolus
administration of intravenous contrast. Multiplanar CT image
reconstructions and MIPs were obtained to evaluate the vascular
anatomy.
CONTRAST:  125mL [id number] IOPAMIDOL ([id number]) INJECTION 76%

[Series 5: mpr axial upper cta whole body · axial · 0.63mm/px · z∈[+1096,+1772]mm · 7 of 436 slices shown, 12 images]
[im 49/436  soft-tissue]
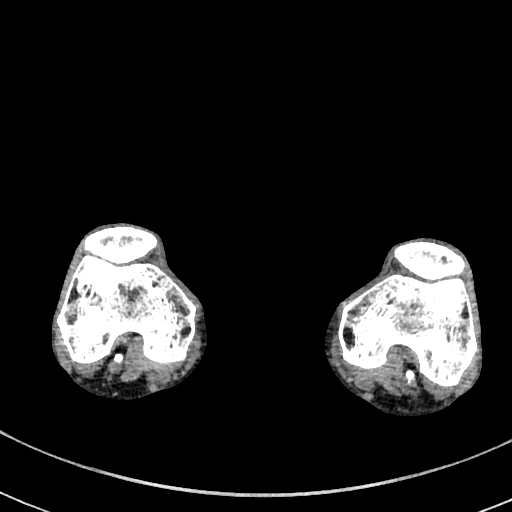
[im 49/436  bone]
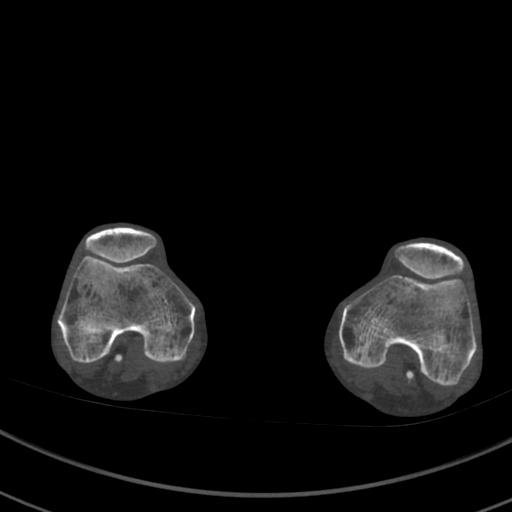
[im 97/436  soft-tissue]
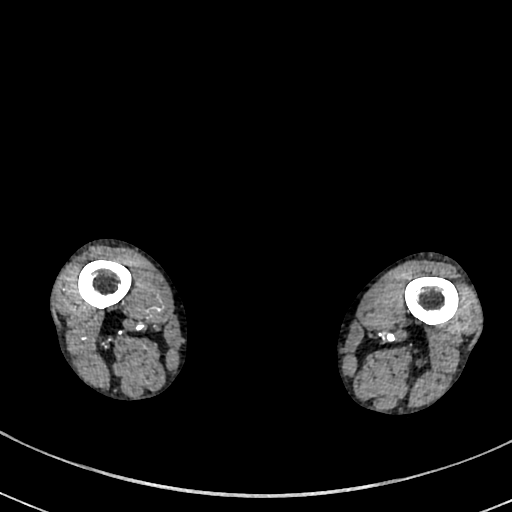
[im 146/436  soft-tissue]
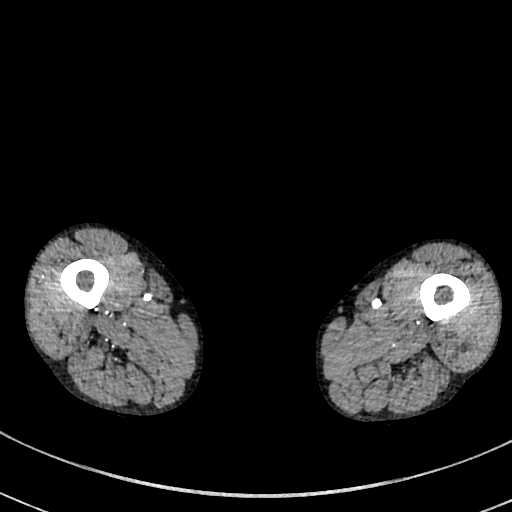
[im 242/436  soft-tissue]
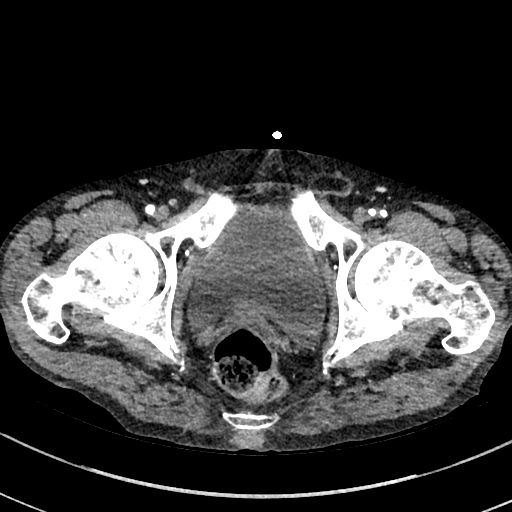
[im 242/436  lung]
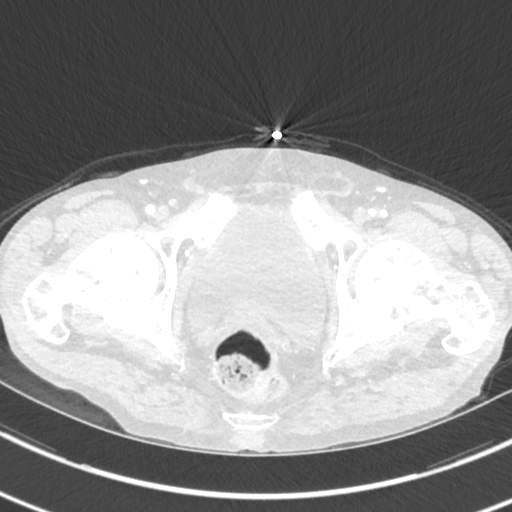
[im 291/436  soft-tissue]
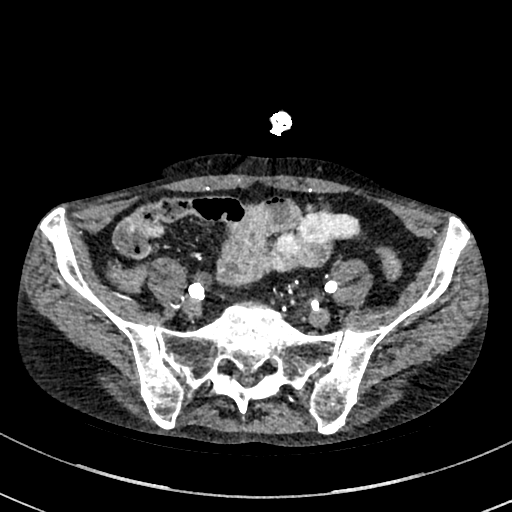
[im 291/436  lung]
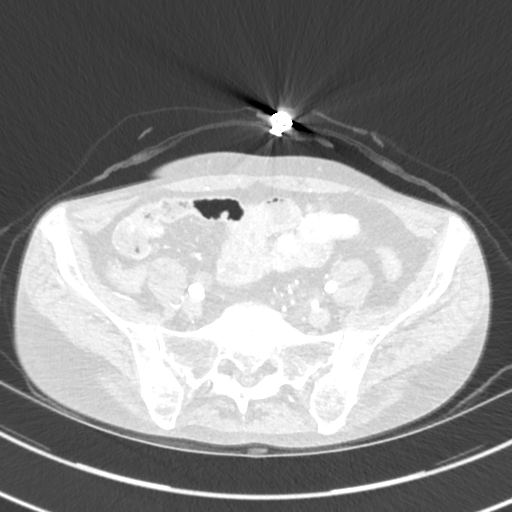
[im 339/436  soft-tissue]
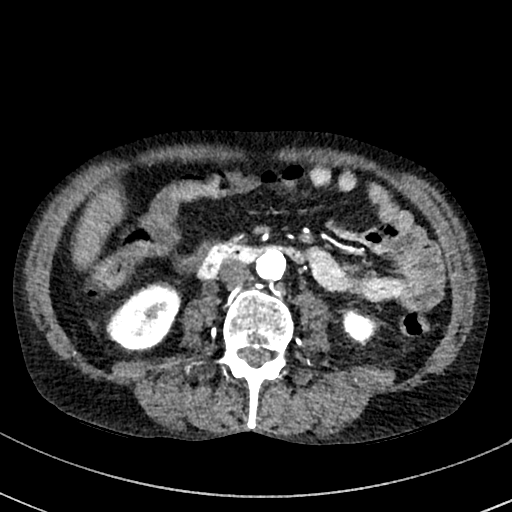
[im 339/436  lung]
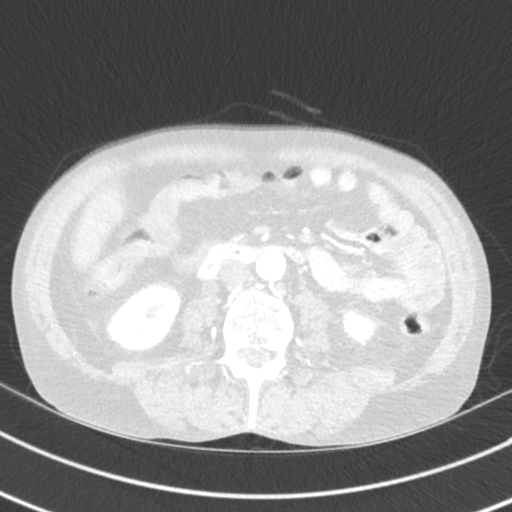
[im 387/436  soft-tissue]
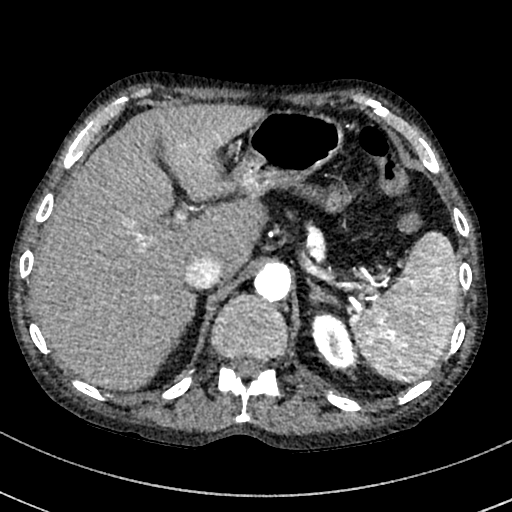
[im 387/436  lung]
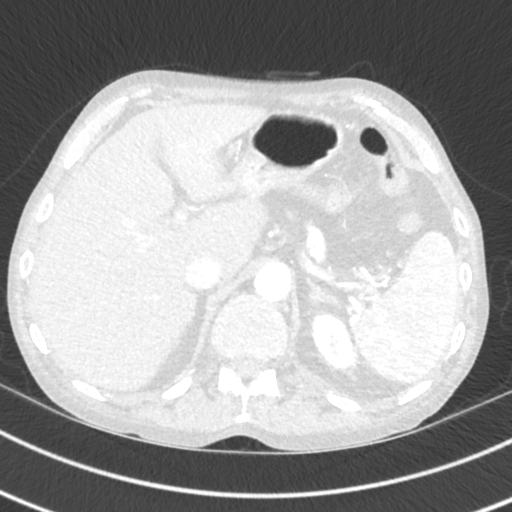

[Series 7: mpr coronal upper cta whole body · coronal · 0.63mm/px · 1 of 162 slices shown]
[im 81/162  soft-tissue]
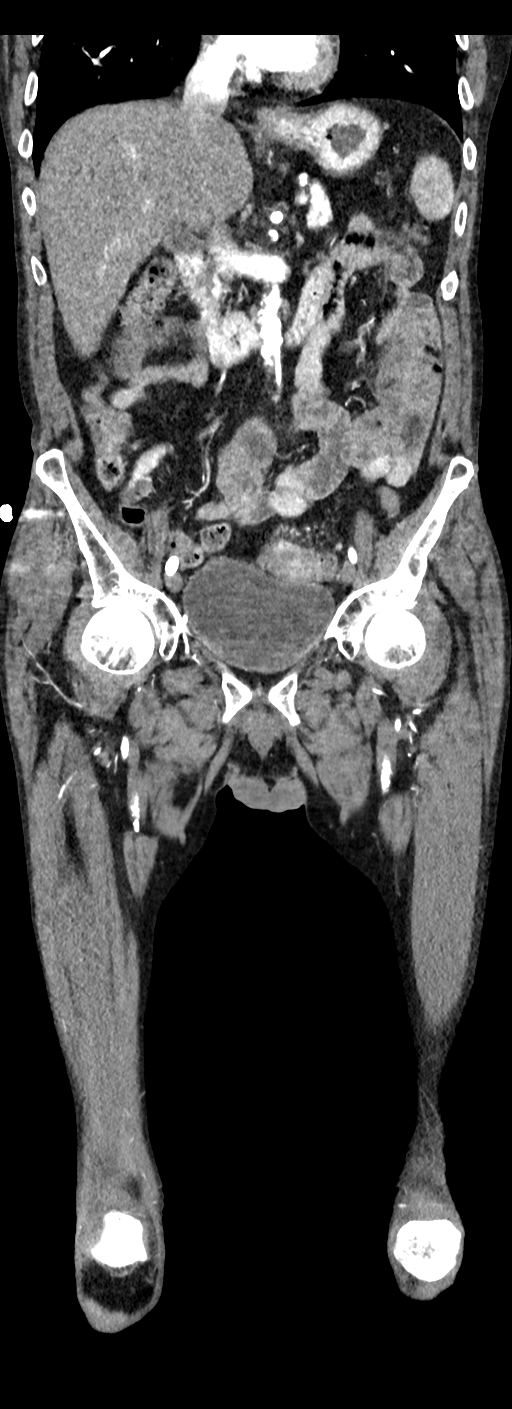

[8 of 46 positions shown; findings below may reference images not displayed]

FINDINGS: VASCULAR

Aorta: Moderate to advanced atherosclerotic changes of the
visualized aorta with mixed calcified and soft plaque. The greatest
diameter of the visualized thoracic aorta at the hiatus measures
cm. Greatest diameter of the abdominal aorta is just inferior to the
renal arteries at the site of likely chronic dissection, with a
transverse diameter of 2.9 cm. No periaortic fluid. The chronic
dissection is limited to approximately 1 cm segment (image 88 of
series 5). Diameter of the aorta just above the IMA origin measures
2.4 cm.

Celiac: Celiac artery stenotic at the origin, likely secondary to
compression from the diaphragmatic crus. No significant calcified
disease.

SMA: Moderate atherosclerotic changes at the SMA origin with perhaps
50% narrowing. Branch vessels are patent.

Renals: Single right renal artery with moderate atherosclerotic
changes at the origin.

Single left renal artery with moderate atherosclerotic changes at
the origin.

IMA: IMA is patent

Right lower extremity:

Atherosclerotic changes of the native right iliac system with patent
stent system from the common iliac artery origin to the external
iliac artery, terminating just above the origin of the inferior
epigastric artery. Stent system crosses the hypogastric artery which
is occluded at the origin, with pelvic reconstitution. No evidence
of in stent stenosis or occlusion.

Common femoral artery is patent with mild atherosclerotic changes.

Profunda femoris is patent.

Advanced atherosclerotic changes of the right superficial femoral
artery, with diffuse disease including occlusion in the abductor
canal. Reconstitution of the popliteal artery at the knee with mild
atherosclerotic changes of the popliteal artery.

Anterior tibial artery appears patent from the origin to the ankle.

Tibioperoneal trunk patent.

Proximal peroneal artery and posterior tibial artery are patent
proximally, with decreased contrast distally, potentially occluded
at the ankle.

Left lower extremity:

Moderate to advanced atherosclerotic changes of the left iliac
system without significant stenosis. No aneurysm. No inflammatory
changes. Hypogastric artery is patent.

Common femoral artery demonstrates high bifurcation.

Profunda femoris patent.

Moderate atherosclerotic changes of the left superficial femoral
artery without CT evidence of occlusion or significant stenosis.
Worst degree of disease within the abductor canal.

Popliteal artery is patent.

Tibioperoneal trunk patent.

The proximal anterior tibial artery, posterior tibial artery, and
peroneal artery are patent. There is no contrast within the anterior
tibial artery at the ankle.

No contrast within the peroneal artery at the ankle.

Posterior tibial artery is patent from the origin to the ankle.

Veins: Unremarkable appearance of the venous system.

Review of the MIP images confirms the above findings.

NON-VASCULAR

Lower chest: Motion artifact somewhat limits evaluation of lung
bases, however, no confluent airspace disease, pneumothorax or
pleural effusion. Likely changes of emphysema.

Hiatal hernia.

Hepatobiliary: Unremarkable appearance of the liver. Unremarkable
gall bladder.

Pancreas: Unremarkable appearance of the pancreas. No
pericholecystic fluid or inflammatory changes. Unremarkable ductal
system.

Spleen: Unremarkable.

Adrenals/Urinary Tract: Unremarkable appearance of adrenal glands.

Right:

No hydronephrosis. Symmetric perfusion to the left. No
nephrolithiasis. Unremarkable course of the right ureter.

Left:

No hydronephrosis. Symmetric perfusion to the right. No
nephrolithiasis. Unremarkable course of the left ureter.

Urinary bladder unremarkable, partially distended

Stomach/Bowel: Hiatal hernia. Unremarkable stomach. Unremarkable
appearance of small bowel. No evidence of obstruction. Normal
appendix. Diverticular change throughout the length of the colon.

There is circumferential wall thickening of the sigmoid colon. There
is a linear configuration of gas which appears outside of the
sigmoid colon lumen, projecting inferior to the sigmoid though
continuous with the colonic wall (image series [DATE] of series 5).
No evidence of abscess.

Lymphatic: No pelvic lymphadenopathy. Mild edema/hazy infiltration
of the fat adjacent to the sigmoid colon. No periaortic/preaortic
lymphadenopathy.

Reproductive: Unremarkable appearance of the pelvic organs.

Other: No hernia.

Musculoskeletal: No acute displaced fracture. Degenerative changes
of the visualized thoracolumbar spine. Most advanced narrowing at
L4-L5 where there is broad-based disc bulge and facet change.

Degenerative changes at the hips and knees.
IMPRESSION: Multilevel vascular disease, including:

- moderate to advanced aortic atherosclerosis, including
short-segment chronic dissection of the infrarenal abdominal aorta,
with associated aortic ectasia of 2.9 cm. Aortic Atherosclerosis
([id number].M). Aortic aneurysm NOS ([id number].K). Ectatic abdominal
aorta at risk for aneurysm development. Recommend followup by
ultrasound in 5 years. This recommendation follows ACR consensus
guidelines: White Paper of the ACR Incidental Findings Committee II
on Vascular Findings. [HOSPITAL] 3772; [DATE].

- right iliac disease with patent iliac stent system and no evidence
of in stent stenosis.

-advanced right femoropopliteal disease with diffuse SFA disease
including chronic total occlusion in the adductor canal.
Reconstitution of the popliteal artery via collateral

- mild right tibial disease, with patent anterior tibial artery and
questionable patency of distal posterior tibial artery and peroneal
artery.

- moderate to advanced left iliac disease with no occlusion or
evidence of stenosis.

-moderate left femoropopliteal disease, and moderate left tibial
disease, with in-line flow to the ankle via posterior tibial artery.
Questionable patency of the left anterior tibial artery and peroneal
artery at the ankle.

Mesenteric and bilateral renal artery disease, without evidence of
high-grade stenosis.

Severe diverticular change throughout the length of the colon. There
is linear lucency/gas adjacent to the sigmoid colon, associated with
the colonic wall. This may reflect chronic diverticulitis, or a
previous, undiagnosed perforation. No evidence of abscess formation.
Correlation with symptoms of diverticulitis and referral for
colonoscopy, if not already performed, is recommended as sigmoid
malignancy cannot be excluded on the CT.

These results were called by telephone at the time of interpretation
on 10/23/2017 at [DATE] to Dr. RAPPERHOLIC DEMORDZI , who verbally
acknowledged these results.
